# Patient Record
Sex: Female | Born: 1935 | Race: White | Hispanic: No | Smoking: Former smoker
Health system: Southern US, Community
[De-identification: ages and names within clinical notes are randomized; demographics above are authoritative.]

## PROBLEM LIST (undated history)

## (undated) DIAGNOSIS — I714 Abdominal aortic aneurysm, without rupture, unspecified: Secondary | ICD-10-CM

## (undated) DIAGNOSIS — J310 Chronic rhinitis: Secondary | ICD-10-CM

## (undated) DIAGNOSIS — N289 Disorder of kidney and ureter, unspecified: Secondary | ICD-10-CM

## (undated) DIAGNOSIS — I10 Essential (primary) hypertension: Secondary | ICD-10-CM

## (undated) DIAGNOSIS — I701 Atherosclerosis of renal artery: Secondary | ICD-10-CM

## (undated) DIAGNOSIS — I350 Nonrheumatic aortic (valve) stenosis: Secondary | ICD-10-CM

## (undated) DIAGNOSIS — I509 Heart failure, unspecified: Secondary | ICD-10-CM

## (undated) DIAGNOSIS — I2510 Atherosclerotic heart disease of native coronary artery without angina pectoris: Secondary | ICD-10-CM

## (undated) DIAGNOSIS — R0902 Hypoxemia: Secondary | ICD-10-CM

## (undated) HISTORY — PX: OTHER SURGICAL HISTORY: SHX169

## (undated) HISTORY — PX: TUMOR REMOVAL: SHX12

## (undated) HISTORY — DX: Essential (primary) hypertension: I10

## (undated) HISTORY — PX: ANGIOPLASTY: SHX39

## (undated) HISTORY — PX: ABDOMINAL AORTIC ANEURYSM REPAIR: SUR1152

## (undated) HISTORY — DX: Heart failure, unspecified: I50.9

## (undated) HISTORY — DX: Nonrheumatic aortic (valve) stenosis: I35.0

## (undated) HISTORY — DX: Atherosclerotic heart disease of native coronary artery without angina pectoris: I25.10

## (undated) HISTORY — DX: Atherosclerosis of renal artery: I70.1

## (undated) HISTORY — PX: TUBAL LIGATION: SHX77

## (undated) HISTORY — DX: Hypoxemia: R09.02

## (undated) HISTORY — DX: Disorder of kidney and ureter, unspecified: N28.9

## (undated) HISTORY — PX: TOTAL ABDOMINAL HYSTERECTOMY: SHX209

## (undated) HISTORY — PX: HERNIA REPAIR: SHX51

## (undated) HISTORY — PX: APPENDECTOMY: SHX54

## (undated) HISTORY — DX: Chronic rhinitis: J31.0

## (undated) HISTORY — DX: Abdominal aortic aneurysm, without rupture: I71.4

## (undated) HISTORY — DX: Abdominal aortic aneurysm, without rupture, unspecified: I71.40

---

## 1997-07-19 ENCOUNTER — Other Ambulatory Visit: Admission: RE | Admit: 1997-07-19 | Discharge: 1997-07-19 | Payer: Self-pay | Admitting: Family Medicine

## 1998-11-28 ENCOUNTER — Other Ambulatory Visit: Admission: RE | Admit: 1998-11-28 | Discharge: 1998-11-28 | Payer: Self-pay | Admitting: Family Medicine

## 2000-02-12 ENCOUNTER — Other Ambulatory Visit: Admission: RE | Admit: 2000-02-12 | Discharge: 2000-02-12 | Payer: Self-pay | Admitting: Family Medicine

## 2001-03-05 ENCOUNTER — Other Ambulatory Visit: Admission: RE | Admit: 2001-03-05 | Discharge: 2001-03-05 | Payer: Self-pay | Admitting: Family Medicine

## 2003-04-22 ENCOUNTER — Inpatient Hospital Stay (HOSPITAL_COMMUNITY): Admission: AD | Admit: 2003-04-22 | Discharge: 2003-04-29 | Payer: Self-pay | Admitting: General Surgery

## 2003-04-26 ENCOUNTER — Encounter (INDEPENDENT_AMBULATORY_CARE_PROVIDER_SITE_OTHER): Payer: Self-pay | Admitting: Specialist

## 2004-08-06 ENCOUNTER — Inpatient Hospital Stay (HOSPITAL_COMMUNITY): Admission: EM | Admit: 2004-08-06 | Discharge: 2004-08-10 | Payer: Self-pay | Admitting: Emergency Medicine

## 2004-08-07 ENCOUNTER — Ambulatory Visit: Payer: Self-pay | Admitting: Internal Medicine

## 2004-08-07 ENCOUNTER — Encounter (INDEPENDENT_AMBULATORY_CARE_PROVIDER_SITE_OTHER): Payer: Self-pay | Admitting: Cardiology

## 2004-08-08 ENCOUNTER — Ambulatory Visit: Payer: Self-pay | Admitting: Cardiology

## 2004-09-28 ENCOUNTER — Ambulatory Visit (HOSPITAL_COMMUNITY): Admission: RE | Admit: 2004-09-28 | Discharge: 2004-09-28 | Payer: Self-pay | Admitting: Family Medicine

## 2004-10-23 ENCOUNTER — Ambulatory Visit: Payer: Self-pay

## 2004-10-23 ENCOUNTER — Ambulatory Visit: Payer: Self-pay | Admitting: Cardiology

## 2005-01-26 ENCOUNTER — Ambulatory Visit: Payer: Self-pay | Admitting: Cardiology

## 2005-03-09 ENCOUNTER — Ambulatory Visit: Payer: Self-pay | Admitting: Internal Medicine

## 2005-03-09 ENCOUNTER — Ambulatory Visit: Payer: Self-pay | Admitting: Cardiology

## 2005-11-05 ENCOUNTER — Ambulatory Visit: Payer: Self-pay

## 2005-12-20 ENCOUNTER — Encounter: Admission: RE | Admit: 2005-12-20 | Discharge: 2005-12-20 | Payer: Self-pay | Admitting: *Deleted

## 2006-01-08 ENCOUNTER — Ambulatory Visit (HOSPITAL_COMMUNITY): Admission: RE | Admit: 2006-01-08 | Discharge: 2006-01-08 | Payer: Self-pay | Admitting: *Deleted

## 2006-02-26 ENCOUNTER — Ambulatory Visit: Payer: Self-pay | Admitting: Cardiology

## 2006-03-28 ENCOUNTER — Ambulatory Visit: Payer: Self-pay | Admitting: *Deleted

## 2006-04-01 ENCOUNTER — Inpatient Hospital Stay (HOSPITAL_COMMUNITY): Admission: RE | Admit: 2006-04-01 | Discharge: 2006-04-08 | Payer: Self-pay | Admitting: *Deleted

## 2006-04-01 ENCOUNTER — Encounter (INDEPENDENT_AMBULATORY_CARE_PROVIDER_SITE_OTHER): Payer: Self-pay | Admitting: *Deleted

## 2006-04-01 ENCOUNTER — Ambulatory Visit: Payer: Self-pay | Admitting: *Deleted

## 2006-04-16 ENCOUNTER — Ambulatory Visit: Payer: Self-pay | Admitting: *Deleted

## 2006-05-02 ENCOUNTER — Ambulatory Visit: Payer: Self-pay | Admitting: *Deleted

## 2007-03-20 ENCOUNTER — Ambulatory Visit: Payer: Self-pay | Admitting: Cardiology

## 2008-02-02 ENCOUNTER — Ambulatory Visit: Payer: Self-pay | Admitting: Internal Medicine

## 2008-02-02 ENCOUNTER — Inpatient Hospital Stay (HOSPITAL_COMMUNITY): Admission: EM | Admit: 2008-02-02 | Discharge: 2008-02-08 | Payer: Self-pay | Admitting: Emergency Medicine

## 2008-02-02 ENCOUNTER — Ambulatory Visit: Payer: Self-pay | Admitting: Pulmonary Disease

## 2008-02-03 ENCOUNTER — Encounter: Payer: Self-pay | Admitting: Pulmonary Disease

## 2008-02-04 ENCOUNTER — Encounter (INDEPENDENT_AMBULATORY_CARE_PROVIDER_SITE_OTHER): Payer: Self-pay | Admitting: Internal Medicine

## 2008-03-15 ENCOUNTER — Ambulatory Visit: Payer: Self-pay | Admitting: Cardiology

## 2008-03-26 ENCOUNTER — Ambulatory Visit: Payer: Self-pay | Admitting: Cardiology

## 2008-03-26 LAB — CONVERTED CEMR LAB
BUN: 26 mg/dL — ABNORMAL HIGH (ref 6–23)
CO2: 32 meq/L (ref 19–32)
Chloride: 98 meq/L (ref 96–112)
GFR calc non Af Amer: 45 mL/min
Glucose, Bld: 107 mg/dL — ABNORMAL HIGH (ref 70–99)
Potassium: 4.3 meq/L (ref 3.5–5.1)

## 2008-04-01 DIAGNOSIS — I10 Essential (primary) hypertension: Secondary | ICD-10-CM | POA: Insufficient documentation

## 2008-04-02 ENCOUNTER — Ambulatory Visit: Payer: Self-pay | Admitting: Pulmonary Disease

## 2008-04-02 DIAGNOSIS — J309 Allergic rhinitis, unspecified: Secondary | ICD-10-CM | POA: Insufficient documentation

## 2008-04-02 DIAGNOSIS — J9610 Chronic respiratory failure, unspecified whether with hypoxia or hypercapnia: Secondary | ICD-10-CM

## 2008-05-04 ENCOUNTER — Ambulatory Visit: Payer: Self-pay | Admitting: Cardiology

## 2008-05-04 ENCOUNTER — Encounter: Payer: Self-pay | Admitting: Cardiology

## 2008-05-05 ENCOUNTER — Telehealth (INDEPENDENT_AMBULATORY_CARE_PROVIDER_SITE_OTHER): Payer: Self-pay | Admitting: *Deleted

## 2008-05-20 ENCOUNTER — Emergency Department (HOSPITAL_COMMUNITY): Admission: EM | Admit: 2008-05-20 | Discharge: 2008-05-21 | Payer: Self-pay | Admitting: Emergency Medicine

## 2008-06-10 ENCOUNTER — Encounter: Payer: Self-pay | Admitting: Pulmonary Disease

## 2008-09-08 ENCOUNTER — Encounter (INDEPENDENT_AMBULATORY_CARE_PROVIDER_SITE_OTHER): Payer: Self-pay | Admitting: *Deleted

## 2009-02-02 ENCOUNTER — Telehealth: Payer: Self-pay | Admitting: Cardiology

## 2010-02-12 ENCOUNTER — Encounter: Payer: Self-pay | Admitting: Family Medicine

## 2010-02-21 NOTE — Progress Notes (Signed)
Summary: REFILL  Medications Added PLAVIX 75 MG TABS (CLOPIDOGREL BISULFATE) once daily LASIX 40 MG TABS (FUROSEMIDE) 1/2 tab every day LASIX 40 MG TABS (FUROSEMIDE) 1 by mouth daily ABC PLUS  TABS (MULTIPLE VITAMINS-MINERALS) once daily [facility] NIACIN 100 MG TABS (NIACIN) 4 tabs daily [facility] NIACIN 100 MG TABS (NIACIN) 2tabs daily NORVASC 5 MG TABS (AMLODIPINE BESYLATE) once daily NORVASC 5 MG TABS (AMLODIPINE BESYLATE) once daily ADPRIN B 325 MG TABS (ASPIRIN BUF(CACARB-MGCARB-MGO)) once daily ADPRIN B 325 MG TABS (ASPIRIN BUF(CACARB-MGCARB-MGO)) once daily LOVASTATIN 20 MG TABS (LOVASTATIN) once daily * AMLODIPINE BESYLATE 2. 5 MG TABS (AMLODIPINE BESYLATE) 1 1/2 tabs daily * AMLODIPINE BESYLATE 2. 5 MG TABS (AMLODIPINE BESYLATE) 1 tabs daily AMLODIPINE BESYLATE 5 MG TABS (AMLODIPINE BESYLATE) 1 1/2 tabs daily FERRO-BOB 325 (65 FE) MG TABS (FERROUS SULFATE) two times a day FERRO-BOB 325 (65 FE) MG TABS (FERROUS SULFATE) two times a day NITRO-DUR 0.4 MG/HR PT24 (NITROGLYCERIN) as needed NITROGLYCERIN 0.4 MG SUBL (NITROGLYCERIN) as directed VENTOLIN HFA 108 (90 BASE) MCG/ACT AERS (ALBUTEROL SULFATE) take two puffs every 4 hours as needed * ASPRIN 325MG  one by mouth daily       Phone Note Refill Request Message from:  Patient on February 02, 2009 2:04 PM  Refills Requested: Medication #1:  PLAVIX 75 MG TABS once daily SEND TO [place] [phone]  Initial call taken by: Judie Grieve,  February 02, 2009 2:04 PM  Follow-up for Phone Call        Rx sent to pharmacy. Pt notified. Marrion Coy, CNA  February 02, 2009 2:33 PM  Follow-up by: Marrion Coy, CNA,  February 02, 2009 2:33 PM    Prescriptions: PLAVIX 75 MG TABS (CLOPIDOGREL BISULFATE) once daily  #30 x 8   Entered by:   Marrion Coy, CNA   Authorized by:   Rollene Rotunda, MD, [facility]   Signed by:   Marrion Coy, CNA on 02/02/2009   Method used:   Electronically to        [facility]* (retail)  [address] [place], [place]  [address]       Ph: [phone] or [phone]       Fax: [phone]   RxID:   [id number] PLAVIX 75 MG TABS (CLOPIDOGREL BISULFATE) once daily  #30 x 8   Entered by:   Marrion Coy, CNA   Authorized by:   Rollene Rotunda, MD, [facility]   Signed by:   Marrion Coy, CNA on 02/02/2009   Method used:   [facility]ronically to        [id number] ([facility]       [address]       [place], [place]  [address]       Ph: [phone] or [phone]       Fax: [phone]   RxID:   [id number]

## 2010-05-03 LAB — DIFFERENTIAL
Basophils Absolute: 0 10*3/uL (ref 0.0–0.1)
Eosinophils Relative: 2 % (ref 0–5)
Lymphocytes Relative: 23 % (ref 12–46)
Monocytes Absolute: 0.5 10*3/uL (ref 0.1–1.0)

## 2010-05-03 LAB — CBC
HCT: 33.3 % — ABNORMAL LOW (ref 36.0–46.0)
Hemoglobin: 11.1 g/dL — ABNORMAL LOW (ref 12.0–15.0)
RDW: 15.5 % (ref 11.5–15.5)

## 2010-05-03 LAB — BASIC METABOLIC PANEL
CO2: 32 mEq/L (ref 19–32)
GFR calc non Af Amer: 35 mL/min — ABNORMAL LOW (ref >60)
Glucose, Bld: 133 mg/dL — ABNORMAL HIGH (ref 70–99)
Potassium: 4.4 mEq/L (ref 3.5–5.1)
Sodium: 133 mEq/L — ABNORMAL LOW (ref 135–145)

## 2010-05-03 LAB — URINALYSIS, ROUTINE W REFLEX MICROSCOPIC
Bilirubin Urine: NEGATIVE
Glucose, UA: NEGATIVE mg/dL
Hgb urine dipstick: NEGATIVE
Ketones, ur: NEGATIVE mg/dL
Nitrite: NEGATIVE
pH: 6 (ref 5.0–8.0)

## 2010-05-08 LAB — BASIC METABOLIC PANEL
BUN: 29 mg/dL — ABNORMAL HIGH (ref 6–23)
BUN: 38 mg/dL — ABNORMAL HIGH (ref 6–23)
BUN: 38 mg/dL — ABNORMAL HIGH (ref 6–23)
BUN: 41 mg/dL — ABNORMAL HIGH (ref 6–23)
BUN: 44 mg/dL — ABNORMAL HIGH (ref 6–23)
CO2: 29 mEq/L (ref 19–32)
CO2: 35 mEq/L — ABNORMAL HIGH (ref 19–32)
CO2: 36 mEq/L — ABNORMAL HIGH (ref 19–32)
CO2: 40 mEq/L — ABNORMAL HIGH (ref 19–32)
CO2: 38 mEq/L — ABNORMAL HIGH (ref 19–32)
Calcium: 8.5 mg/dL (ref 8.4–10.5)
Calcium: 8.7 mg/dL (ref 8.4–10.5)
Calcium: 9 mg/dL (ref 8.4–10.5)
Calcium: 8.4 mg/dL (ref 8.4–10.5)
Chloride: 98 mEq/L (ref 96–112)
Creatinine, Ser: 1.47 mg/dL — ABNORMAL HIGH (ref 0.4–1.2)
Creatinine, Ser: 1.58 mg/dL — ABNORMAL HIGH (ref 0.4–1.2)
Creatinine, Ser: 1.64 mg/dL — ABNORMAL HIGH (ref 0.4–1.2)
Creatinine, Ser: 1.77 mg/dL — ABNORMAL HIGH (ref 0.4–1.2)
Creatinine, Ser: 1.78 mg/dL — ABNORMAL HIGH (ref 0.4–1.2)
Creatinine, Ser: 1.94 mg/dL — ABNORMAL HIGH (ref 0.4–1.2)
Creatinine, Ser: 1.6 mg/dL — ABNORMAL HIGH (ref 0.4–1.2)
GFR calc Af Amer: 37 mL/min — ABNORMAL LOW (ref >60)
GFR calc non Af Amer: 29 mL/min — ABNORMAL LOW (ref >60)
GFR calc non Af Amer: 31 mL/min — ABNORMAL LOW (ref >60)
GFR calc non Af Amer: 33 mL/min — ABNORMAL LOW (ref >60)
GFR calc non Af Amer: 28 mL/min — ABNORMAL LOW (ref >60)
GFR calc non Af Amer: 30 mL/min — ABNORMAL LOW (ref >60)
Glucose, Bld: 117 mg/dL — ABNORMAL HIGH (ref 70–99)
Glucose, Bld: 127 mg/dL — ABNORMAL HIGH (ref 70–99)
Glucose, Bld: 129 mg/dL — ABNORMAL HIGH (ref 70–99)
Glucose, Bld: 115 mg/dL — ABNORMAL HIGH (ref 70–99)
Potassium: 4.4 mEq/L (ref 3.5–5.1)
Potassium: 4.4 mEq/L (ref 3.5–5.1)
Sodium: 142 mEq/L (ref 135–145)
Sodium: 145 mEq/L (ref 135–145)

## 2010-05-08 LAB — CBC
HCT: 28 % — ABNORMAL LOW (ref 36.0–46.0)
HCT: 28 % — ABNORMAL LOW (ref 36.0–46.0)
Hemoglobin: 9.4 g/dL — ABNORMAL LOW (ref 12.0–15.0)
Hemoglobin: 9.9 g/dL — ABNORMAL LOW (ref 12.0–15.0)
MCHC: 31.7 g/dL (ref 30.0–36.0)
MCHC: 31.7 g/dL (ref 30.0–36.0)
MCHC: 31.9 g/dL (ref 30.0–36.0)
MCHC: 31.9 g/dL (ref 30.0–36.0)
MCV: 85.5 fL (ref 78.0–100.0)
MCV: 86.1 fL (ref 78.0–100.0)
MCV: 86.2 fL (ref 78.0–100.0)
MCV: 85.3 fL (ref 78.0–100.0)
Platelets: 267 10*3/uL (ref 150–400)
Platelets: 292 10*3/uL (ref 150–400)
Platelets: 307 10*3/uL (ref 150–400)
Platelets: 245 10*3/uL (ref 150–400)
RBC: 3.45 MIL/uL — ABNORMAL LOW (ref 3.87–5.11)
RDW: 15.3 % (ref 11.5–15.5)
RDW: 15.4 % (ref 11.5–15.5)
RDW: 15.3 % (ref 11.5–15.5)
WBC: 9.1 10*3/uL (ref 4.0–10.5)

## 2010-05-08 LAB — CARDIAC PANEL(CRET KIN+CKTOT+MB+TROPI)
CK, MB: 2.2 ng/mL (ref 0.3–4.0)
Relative Index: INVALID (ref 0.0–2.5)
Troponin I: 0.01 ng/mL (ref 0.00–0.06)
Troponin I: 0.02 ng/mL (ref 0.00–0.06)

## 2010-05-08 LAB — LEGIONELLA ANTIGEN, URINE: Legionella Antigen, Urine: NEGATIVE

## 2010-05-08 LAB — CULTURE, BLOOD (ROUTINE X 2)

## 2010-05-08 LAB — BODY FLUID CELL COUNT WITH DIFFERENTIAL
Eos, Fluid: 0 %
Total Nucleated Cell Count, Fluid: 945 cu mm (ref 0–1000)

## 2010-05-08 LAB — CK TOTAL AND CKMB (NOT AT ARMC)
CK, MB: 3.4 ng/mL (ref 0.3–4.0)
Relative Index: INVALID (ref 0.0–2.5)
Total CK: 32 U/L (ref 7–177)

## 2010-05-08 LAB — URINALYSIS, ROUTINE W REFLEX MICROSCOPIC
Bilirubin Urine: NEGATIVE
Hgb urine dipstick: NEGATIVE
Ketones, ur: NEGATIVE mg/dL
Nitrite: NEGATIVE
Specific Gravity, Urine: 1.018 (ref 1.005–1.030)
Urobilinogen, UA: 1 mg/dL (ref 0.0–1.0)

## 2010-05-08 LAB — DIFFERENTIAL
Basophils Absolute: 0 10*3/uL (ref 0.0–0.1)
Basophils Relative: 0 % (ref 0–1)
Eosinophils Absolute: 0.1 10*3/uL (ref 0.0–0.7)
Eosinophils Relative: 1 % (ref 0–5)
Lymphocytes Relative: 22 % (ref 12–46)
Monocytes Absolute: 0.6 10*3/uL (ref 0.1–1.0)

## 2010-05-08 LAB — URINE CULTURE

## 2010-05-08 LAB — STREP PNEUMONIAE URINARY ANTIGEN: Strep Pneumo Urinary Antigen: NEGATIVE

## 2010-05-08 LAB — BLOOD GAS, ARTERIAL
Acid-Base Excess: 7 mmol/L — ABNORMAL HIGH (ref 0.0–2.0)
Bicarbonate: 32.5 mEq/L — ABNORMAL HIGH (ref 20.0–24.0)
TCO2: 34.3 mmol/L (ref 0–100)
pCO2 arterial: 60.3 mmHg (ref 35.0–45.0)
pO2, Arterial: 52.6 mmHg — ABNORMAL LOW (ref 80.0–100.0)

## 2010-05-08 LAB — PROTEIN, BODY FLUID

## 2010-05-08 LAB — TYPE AND SCREEN

## 2010-05-08 LAB — D-DIMER, QUANTITATIVE: D-Dimer, Quant: 4.45 ug/mL-FEU — ABNORMAL HIGH (ref 0.00–0.48)

## 2010-05-08 LAB — IRON AND TIBC: Iron: 25 ug/dL — ABNORMAL LOW (ref 42–135)

## 2010-05-08 LAB — BRAIN NATRIURETIC PEPTIDE: Pro B Natriuretic peptide (BNP): 682 pg/mL — ABNORMAL HIGH (ref 0.0–100.0)

## 2010-05-08 LAB — URINE MICROSCOPIC-ADD ON

## 2010-05-08 LAB — BODY FLUID CULTURE: Culture: NO GROWTH

## 2010-05-08 LAB — TROPONIN I: Troponin I: 0.01 ng/mL (ref 0.00–0.06)

## 2010-05-08 LAB — MAGNESIUM: Magnesium: 2 mg/dL (ref 1.5–2.5)

## 2010-06-06 NOTE — Assessment & Plan Note (Signed)
[facility]                            CARDIOLOGY OFFICE NOTE   Eileen Cruz, Eileen Cruz                        MRN:          [id number]  DATE:03/15/2008                            DOB:          1915/06/09    PRIMARY CARE PHYSICIAN:  Windle Guard, MD   REASON FOR PRESENTATION:  Evaluate the patient with heart failure and a  well-preserved ejection fraction.   HISTORY OF PRESENT ILLNESS:  The patient was hospitalized on February 02, 2008, with acute hypoxemia.  She was thought possibly to have a  pneumonia and was treated for such.  She was also felt to have edema  with a well-preserved ejection fraction.  She was managed with  antibiotics.  She did have a right pleural effusion which was tapped and  found to be transudative.  She was treated with IV Lasix as well.  She  was treated with bronchodilators and eventually sent home on oxygen.  She had been on this p.r.n. before, but was sent home on it,  continuously at 4 L.  She has had a home health nurse who has been able  to decrease this to 3 L.  However, walking the patient around the office  today on 3 L, she dropped into the mid 80s saturation.  She says her  breathing feels fine.  She is not having PND or orthopnea.  She is not  having any palpitations, presyncope, or syncope.  She is not having any  chest discomfort, neck or arm discomfort.   PAST MEDICAL HISTORY:  1. Hypertension x20 years.  2. Mild renal insufficiency.  3. Well-preserved ejection fraction (EF 60%).  4. Mild aortic stenosis by echo.  5. Recent pneumonia.  6. Iron deficiency anemia.  7. History of abdominal aortic aneurysm status post repair in March      2008 by Dr. Madilyn Fireman.  8. Left renal artery stenosis.  9. Glaucoma.  10.Bilateral cataracts.  11.Coronary artery disease (totally occluded right coronary artery      with collaterals.  She underwent Taxus stenting on August 09, 2004).  12.Benign tumor resected from the right  shoulder in 1970.  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  14.Inguinal hernia repair.   ALLERGIES AND INTOLERANCES:  She has been intolerant to beta-blocker.   MEDICATIONS:  1. Plavix 75 mg daily.  2. Aspirin 325 mg daily.  3. Lovastatin 20 mg daily.  4. Niacin 2000 mg b.i.d.  5. Amlodipine 2.5 mg daily.  6. Furosemide 40 mg daily.  7. Iron.   REVIEW OF SYSTEMS:  As stated in the HPI and negative for other systems.   PHYSICAL EXAMINATION:  GENERAL:  The patient is pleasant and in no  distress.  VITAL SIGNS:  Blood pressure 140/70, heart rate 76 and regular, and  weight 174 pounds.  HEENT:  Eyelids unremarkable.  Pupils equal, round, and react to light.  Fundi not visualized.  Oral mucosa unremarkable.  NECK:  No jugular venous distention at 45 degrees.  Carotid upstroke  brisk and symmetric.  No bruits.  No thyromegaly.  LYMPHATICS:  No  cervical, axillary, or inguinal adenopathy.  LUNGS:  Decreased breath sounds, but no wheezing or crackles  bilaterally.  BACK:  No costovertebral angle tenderness.  CHEST:  Unremarkable.  HEART:  PMI not displaced or sustained.  S1 and S2 within normal limits.  No S3, no S4.  No clicks, no rubs, no murmurs.  ABDOMEN:  Obese, positive bowel sounds, normal in frequency and pitch.  No bruits, no rebound, no guarding.  No midline pulsatile mass.  No  organomegaly.  SKIN:  No rashes, no nodules.  EXTREMITIES:  Pulses 2+.  No edema, no cyanosis, no clubbing.  NEUROLOGIC:  Oriented to person, place, and time.  Cranial nerves II-XII  grossly intact.  Motor grossly intact.   EKG, sinus rhythm, rate 76, left axis deviation, no acute ST-T wave  changes.   ASSESSMENT AND PLAN:  1. Dyspnea.  The patient still has profound drops in her saturation      even on 3 L of oxygen.  I think this is very out of portion to any      cardiac disease.  She was not sent home on any bronchodilators or      followup with Pulmonary.   Therefore, I will request a Pulmonary      office visit.  For now, she will continue on the meds as listed.  I      would like for her to get BNP and basic metabolic profile, and we      will arrange to have these drawn.  2. Coronary artery disease.  I do not think she is having any active      ischemia.  At this point, we would manage this conservatively.  She      will continue with risk reduction.  3. Hypertension.  Blood pressure is controlled on the meds listed.      She will continue with these.  4. Renal insufficiency.  I will check this with the BMET ordered.  5. Anemia per Dr. Jeannetta Nap.  6. Abdominal aortic aneurysm repair.  This is repaired in 2008.  I      will follow up to see if the      patient continues to see Dr. Madilyn Fireman.  7. Followup.  I would like to see her back in a couple of months to      reassess.     Rollene Rotunda, MD, [facility]  Electronically Signed    JH/MedQ  DD: 03/15/2008  DT: 03/16/2008  Job #: [id number]   cc:   Windle Guard, M.D.

## 2010-06-06 NOTE — Consult Note (Signed)
NAMEHODAYA, Eileen Cruz NO.:  [id number]   MEDICAL RECORD NO.:  [id number]          PATIENT TYPE:  INP   LOCATION:  3304                         FACILITY:  [facility]   PHYSICIAN:  Duke Salvia, MD, FACCDATE OF BIRTH:  17-Aug-1915   DATE OF CONSULTATION:  02/03/2008  DATE OF DISCHARGE:                                 CONSULTATION   PRIMARY CARDIOLOGIST:  Rollene Rotunda, MD, [facility]   PRIMARY CARE PHYSICIAN:  Windle Guard, MD   REASON FOR CONSULTATION:  CHF.   HISTORY OF PRESENT ILLNESS:  This is a 77 year old Caucasian female with  known history of CAD, status post drug-eluting stent to the right  coronary artery in July 2006, also history of AAA repair in March 2008,  with a history of hypertension and anemia who was being treated as an  outpatient for pneumonia by primary care physician, secondary to right-  sided chest discomfort and then shortness of breath along with cough and  congestion.  The patient also was complaining of some pain with  inspiration.  Over the last 2 weeks, the patient began to feel weaker  despite medications had a increased dyspnea on exertion, shortness of  breath, coughing, and congestion.  She called her son who notified Dr.  Jeannetta Nap, the patient's status and was brought to the emergency room.  Originally, the symptoms started approximately 3 weeks ago, but these  symptoms had been increasing over the last 2 days with more weakness,  shortness of breath, and dyspnea on exertion.  Chest x-ray revealed  pulmonary edema with moderate right pleural effusion.  She was placed on  IV Lasix by admitting MD of 40 mg b.i.d. and given breathing treatments.  Unfortunately, the patient de sats very easily.  She is on a 100% non-  rebreather and when she takes the mask off to take a bite of food, she  drops into the 70s.  The patient has a brother who recently died from a  CVA on Christmas Eve and she has been traveling a lot to [place] during  that  time and had been neglecting being seen by her primary care  physician thinking, it was just a cold, but as symptoms worsened, she  did see Dr. Jeannetta Nap approximately 1 week ago.   REVIEW OF SYSTEMS:  Positive for chest soreness with inspiration  approximately 1 week ago with pain on the right side, shortness of  breath, increasing dyspnea on exertion, cough and congestion, positive  for wheezing.  She denied any frank edema.  She states that she has some  lower extremity edema chronically.  She denies any nausea, vomiting,  diarrhea or fevers, or chills.   PAST MEDICAL HISTORY:  1. Hypertension x20 years.  2. Anemia chronically.  3. History of AAA diagnosed in July 2006, with a AAA repair in March      2008 by Dr. Madilyn Fireman.  4. She has high-grade left renal artery stenosis.  5. She has CAD, status post stent to the total right coronary artery      in July 2006, using tandem overlying Taxus  drug-eluting stents.  6. She had a cardiac catheterization in July 2006, revealing      nonobstructive CAD and the LAD, anomalous left circ, totally-      occluded proximal right coronary artery with clot with left-to-      right collaterals.   PAST SURGICAL HISTORY:  AAA repair in March 2008.   SOCIAL HISTORY:  She lives in [place] alone.  She is retired.  She is  a widow.  She has 2 sons, one in [place] and one here in [place].  She stopped smoking years ago.  Negative for EtOH and negative for  drug use.   FAMILY HISTORY:  Mother deceased in childbirth.  Father deceased with  lung cancer, was a heavy smoker.  She had two brothers who are deceased  from MI and one brother who is recently deceased from a CVA.   CURRENT MEDICATIONS:  1. Albuterol inhaler q.4 h.  2. Amlodipine 7.5 mg daily.  3. Aspirin 325 daily.  4. Plavix 75 mg daily.  5. Lasix 40 mg IV b.i.d.  6. Atrovent 0.5 mg q.4 h.  7. Imdur 30 mg daily.  8. Avelox 400 mg IV q.24 h.  9. Nystatin.  10.Protonix 40 mg daily.   11.Zocor 10 mg nightly.   ALLERGIES:  The patient has no known drug allergies, but she is  intolerant to beta-blockers causing bradycardia.   CURRENT LABORATORIES:  Sodium 142, potassium 3.9, chloride 94, CO2 of  32, BUN 29, creatinine 1.5, GFR 37, and D-dimer is pending, and BNP 906.  Hemoglobin 9.0 (decreased from 9.9 on admission), hematocrit 28, white  blood cells 8.9, and platelets 267.  Troponin 0.01 and 0.02  respectively.  EKG is pending.  Telemetry reveals irregular rhythm with  bigeminy.  Chest x-ray reveals possible pulmonary edema, moderate right  pleural effusion, and mild vascular congestions with cardiomegaly.   PHYSICAL EXAMINATION:  VITAL SIGNS:  Blood pressure 139/35, pulse 75,  respirations 30, temperature 99, O2 sat 91% and 50% Venti mask.  The  patient is currently on 100% nonrebreather.  The patient's weight is  79.6 kg.  GENERAL:  She is awake, alert, and oriented; easily dyspneic.  HEENT:  Head is normocephalic and atraumatic.  Eyes:  PERRLA.  Mucous  membranes:  Mouth pink and moist.  Tongue is midline.  NECK:  Supple.  There is mild JVD without carotid bruits.  CARDIOVASCULAR:  Irregular rate and rhythm with multiple extrasystole.  Pulses are 2+ and equal without bruits.  LUNGS:  Expiratory wheezes and diminished breath sounds on the right.  ABDOMEN:  Soft and nontender with 2+ bowel sounds.  EXTREMITIES:  No clubbing, cyanosis, or edema at present.  NEUROLOGIC:  Cranial nerves II-XII are grossly intact.   IMPRESSIONS:  1. Respiratory insufficiency with abnormal chest x-ray and arterial      blood gas.  2. Known coronary artery disease, status post drug-eluting stent x2 to      the right coronary artery in July 2006.  3. Anemia.  4. Hypertension, well controlled.   PLAN:  This is a 77 year old Caucasian female with known history of CAD,  AAA repair, hypertension, and anemia who presented to [facility]  Emergency Room with increasing shortness of breath  and dyspnea on  exertion with increasing symptoms occurring over the last 3 weeks most  prominent over the last day prior to admission.   The patient has marked right-sided infiltrates with questionable process  etiology, infectious versus fluid with an elevated  BNP.   RECOMMENDATIONS:  Gentle diuresis, to increase Lasix to 80 mg IV b.i.d.  We have spoken with pulmonary for consultation.  We have discussed with  the patient that it may be necessary to intubate and she is agreeable to  this.  We will await echocardiogram to rule out effusion and changes in  LV function.  The patient with anemia, we will guaiac stools.  We will  check magnesium and check her 12-lead and make further recommendations  based upon hospital course and patient's response to treatment.      Bettey Mare. Lyman Bishop, NP      Duke Salvia, MD, [facility]  Electronically Signed    KML/MEDQ  D:  02/03/2008  T:  02/04/2008  Job:  [id number]   cc:   Windle Guard, M.D.

## 2010-06-06 NOTE — Discharge Summary (Signed)
Eileen Cruz, Eileen Cruz                 ACCOUNT NO.:  [id number]   MEDICAL RECORD NO.:  [id number]          PATIENT TYPE:  INP   LOCATION:  5528                         FACILITY:  [facility]   PHYSICIAN:  Elliot Cousin, M.D.    DATE OF BIRTH:  Apr 30, 1915   DATE OF ADMISSION:  02/02/2008  DATE OF DISCHARGE:  02/08/2008                               DISCHARGE SUMMARY   DISCHARGE DIAGNOSES:  1. Acute hypoxic and hypercarbic respiratory failure thought to be      secondary to a combination of acute congestive heart failure and      pneumonia.  2. Acute congestive heart failure, possibly secondary to diastolic      dysfunction.  Per the 2-D echocardiogram performed on February 04, 2008, the patient's ejection fraction was noted to be 55-60%.  3. Mild aortic stenosis per 2-D echocardiogram on February 04, 2008.  4. Pneumonia with small right-sided pleural effusion.  Status post      thoracentesis by Dr. Sung Amabile on February 03, 2008.  5. Acute-on-chronic renal insufficiency.  6. Iron deficiency anemia.   SECONDARY DISCHARGE DIAGNOSES:  1. Hypertension.  2. Chronic anemia.  3. History of abdominal aortic aneurysm, diagnosed in July 2006 with      an abdominal aortic aneurysm repair in March 2008 by Dr. Madilyn Fireman.  4. High-grade left renal artery stenosis.  5. Coronary artery disease status post stent to the total right      coronary artery in July 2006 using tandem overlying Taxus drug-      eluting stents.  6. Status post cardiac catheterization in July 2006 revealing      nonobstructive coronary artery disease in the left anterior      descending, anomalous left circumflex, totally occluded proximal      right coronary artery with clot with left-to-right collaterals.   DISCHARGE MEDICATIONS:  1. Oxygen 4 liters per minute.  2. Avelox 400 mg daily for 5 more days.  3. Ferrous sulfate 325 mg once daily for 1 week and then increase to      b.i.d. thereafter.  4. Albuterol inhaler 2 puffs every  4 hours as needed for wheezing and      shortness of breath.  5. Furosemide 40 mg one whole pill daily (dose increased from half a      pill daily).  6. Lovastatin 20 mg daily.  7. Plavix 75 mg daily.  8. Amlodipine 2.5 mg daily.  9. Aspirin 325 mg daily.   DISCHARGE DISPOSITION:  The patient is being discharged to home in  improved and stable condition.  She was advised to follow up with her  primary cardiologist, Dr. Antoine Poche in 5-7 days and with her primary care  physician, Dr. Jeannetta Nap in 4-5 days.   CONSULTATIONS:  1. Duke Salvia, MD, [facility], and Rollene Rotunda, MD, [facility],      cardiologists.  2. Oley Balm. Sung Amabile, MD   PROCEDURES PERFORMED:  1. Chest x-ray on February 07, 2008.  The results revealed no change in      the right worse than  left airspace disease and effusions.  No      pulmonary edema.  2. Chest x-ray on February 05, 2008.  No change in congestive heart      failure.  Persistent right lower lobe pneumonia.  3. Thoracentesis performed by Dr. Billy Fischer on February 03, 2008.      The results revealed 15 mL of cloudy fluid.  Results of studies      revealed a total protein of less than 3, LDH of 112, cell count of      945 (4% segmented neutrophils and 91% lymphocytes).  4. A 2-D echocardiogram performed on February 04, 2008.  The results      revealed left ventricular ejection fraction estimated to be 55-60%.      No diagnostic left ventricular regional wall motion abnormality.      Left atrium was moderately dilated.  Mild aortic stenosis.  5. Right upper extremity peripherally inserted central catheter      inserted on February 02, 2008, and discontinued on February 08, 2008.   HISTORY OF PRESENT ILLNESS:  The patient is a 77 year old woman with a  past medical history significant for coronary artery disease, status  post abdominal aortic aneurysm repair, hypertension, and chronic anemia.  She presented to the emergency department on February 02, 2008, with a   chief complaint of shortness of breath.  Apparently, the patient  developed shortness of breath and a cough 1 week prior.  She was treated  with doxycycline for pneumonia by her primary care physician.  However,  her symptoms did not improve.  When she was evaluated in the emergency  department, she was noted to be hypoxic with an oxygen saturation of 77%  on room air.  Her chest x-ray revealed a right lower lobe pneumonia and  possible right pleural effusion.  A PICC line was placed in the  emergency department.  She was placed on 100% oxygen supplementation and  subsequently admitted to the ICU.   For additional details, please see the dictated history and physical.   HOSPITAL COURSE:  1. ACUTE HYPOXIC AND HYPERCARBIC RESPIRATORY FAILURE ASSOCIATED WITH      CONGESTIVE HEART FAILURE AND PNEUMONIA.  The patient was admitted      to the ICU for closer monitoring.  She was started empirically on      antibiotic treatment with Zosyn.  Oxygen therapy was administered      to keep her oxygen saturations greater than 90%.  Because of her      cardiac history, cardiac enzymes were ordered as well as a 2-D      echocardiogram.  Her cardiac enzymes were negative and her ejection      fraction was within normal limits, although the echo did reveal      mild aortic stenosis.  A number of other laboratory studies were      ordered including a lactic acid level, which was normal at 0.9; a      urinary strep pneumo antigen, which was negative; and a D-dimer      that was elevated at 4.45.  An ABG on 50% oxygen was ordered and it      revealed a pH of 7.3, pCO2 of 60.3, and a pO2 of 52.6.  Her BNP was      elevated at 906.  Because of the elevated D-dimer, a V/Q scan was      ordered.  Subsequently, [facility]ology were  consulted.  Dr. Graciela Husbands evaluated the patient from a cardiology      standpoint.  Given the patient's overall clinical scenario, he felt      that the patient  had concomitant congestive heart failure.      Therefore, the patient was started on intravenous Lasix b.i.d.  Dr.      Graciela Husbands recommended further evaluation by Pulmonary.  Dr. Sung Amabile      provided the pulmonary consultation.  Given the evidence of a      pleural effusion on chest x-ray, he performed a thoracentesis.  It      yielded 15 mL of a cloudy fluid.  The results of the studies are      above; however in essence, it appeared that the studies indicated      that the pleural fluid was more transudative rather than exudative.      At the time of the initial hospital assessment, the patient was      afebrile and her white blood cell count was marginally elevated.      She was maintained on Zosyn for 24 hours before it was changed to      Avelox.  Dr. Sung Amabile cancelled the V/Q scan in the setting of an      acute pneumonia.  He added albuterol and Atrovent nebulizers to be      scheduled every 6 hours.  The dosing of the Lasix was titrated      downward to 40 mg p.o. daily after she had an effective diuresis.      A followup chest x-ray revealed no significant improvement,      although the patient was significantly symptomatically improved.      She completed a total of 7 days of antibiotic therapy.  She was      discharged home on 5 more days of treatment.  Because of her      ongoing hypoxia, the patient required home oxygen.  On February 07, 2008, her oxygen saturations fell to 77% on 3 L of nasal cannula      oxygen with ambulation.  At rest, her oxygen saturations ranged      from 80-94% on 3-4 L of oxygen supplementation.  Arrangements have      been made for the patient to be discharged home on oxygen therapy.      Prior to hospital discharge, she had no complaints of shortness of      breath or chest pain.  2. ACUTE-ON-CHRONIC RENAL INSUFFICIENCY.  The patient was initially      started on gentle IV fluids.  Her BUN was 36 and her creatinine was      1.47 initially.  With  the initiation of diuretic therapy, her      creatinine increased to 1.94.  The dosing of the Lasix was      decreased.  Prior to hospital discharge, her creatinine improved      slightly to 1.6.  The patient appears to have chronic renal      insufficiency with a creatinine ranging from 1.4-1.6.  3. IRON DEFICIENCY ANEMIA.  The patient's hemoglobin ranged from 8.9      to 9.9 during the hospitalization.  Iron studies were ordered and      revealed a ferritin of 62, total iron of 25, TIBC of 294, and a      percent saturation of 9.  She was started on iron supplementation.  She was advised to continue ferrous sulfate as prescribed upon      discharge.      Elliot Cousin, M.D.  Electronically Signed     DF/MEDQ  D:  02/08/2008  T:  02/09/2008  Job:  [id number]   cc:   Rollene Rotunda, MD, [facility]  Windle Guard, M.D.

## 2010-06-06 NOTE — H&P (Signed)
NAMEJIMMI, SIDENER                 ACCOUNT NO.:  [id number]   MEDICAL RECORD NO.:  [id number]          PATIENT TYPE:  INP   LOCATION:  3304                         FACILITY:  [facility]   PHYSICIAN:  Michelle Nasuti, MD        DATE OF BIRTH:  July 01, 1915   DATE OF ADMISSION:  02/02/2008  DATE OF DISCHARGE:                              HISTORY & PHYSICAL   CHIEF COMPLAINT:  Shortness of breath.   HISTORY OF PRESENT ILLNESS:  This is a very pleasant 77 year old female  with a history of coronary artery disease status post PCI, abdominal  aortic aneurysm status post repair 1 year ago who presents with  shortness of breath for 1 week.  The patient began feeling unwell  approximately 1 week ago when she developed cough and shaking chills.  She states that she has only had low-grade fevers at home.  Her cough  and shortness of breath progressed, and she saw her family physician  this past Thursday.  He performed a chest x-ray which showed right lower  lobe pneumonia, and he prescribed doxycycline for her as an outpatient.  Despite of doxycycline, her shortness of breath continued to progress  until this morning, the day of admission when she noticed not only  shortness of breath but pleuritic chest pain.  She thus presented to the  [facility] Emergency Department for further evaluation.   In the emergency department, she was found to be sating 77% on room air  and in moderate distress.  She was initially placed on a 100% face mask  with improvement in her oxygen saturation.  She received nebulizer  treatment.  She also received IV vancomycin and Zosyn.  A chest x-ray  was repeated, which showed a right lower lobe pneumonia, possible right  pleural effusion.  The patient was treated with intravenous Zosyn and  vancomycin.  In the emergency department, she also received a PICC line  because of difficult intravenous access.   PAST MEDICAL HISTORY:  The patient has a history of coronary artery  disease status post percutaneous coronary intervention.  She has a  history of AAA status post open repair 1 year ago.  She has a history of  hyperlipidemia, history of hypertension.   CURRENT MEDICATIONS AT HOME:  1. Lovastatin 20 mg daily.  2. Plavix 75 mg daily.  3. Amlodipine 7.5 mg daily.  4. Furosemide 40 mg daily.  5. Aspirin 325 mg daily.  6. Doxycycline 100 mg b.i.d.   SOCIAL HISTORY:  The patient lives alone.  She is able to accomplish her  activities of daily living.  She quit smoking 30 years ago.  She does  not drink alcohol or use illicit drugs.   FAMILY HISTORY:  Noncontributory.   REVIEW OF SYSTEMS:  The patient denies nausea, vomiting, or diarrhea.  She has had anorexia with this current illness.  She does admit to some  chest pain today; however, the pain is unlike her prior myocardial  infarction pain.  It is pleuritic in nature.  Review of systems is  otherwise negative, except as  documented in the history of present  illness.   PHYSICAL EXAMINATION:  VITAL SIGNS:  Temperature is 97.3, the heart rate  is 86, and the respiratory rate is 24, the blood pressure is 134/62.  GENERAL:  She is in no acute distress.  HEENT:  She is anicteric.  There is no conjunctival pallor.  The pupils  are equal, round, and reactive to light and accommodation.  There  appears to be a white covering to the tongue.  There is no jugular  venous distention.  CARDIOVASCULAR:  An irregular rhythm.  There are no rubs, murmurs, or  gallops.  PULMONARY:  Distant breath sounds bilaterally.  There are crackles at  the right base.  ABDOMEN:  Soft, nontender, and nondistended.  There is normoactive bowel  sounds.  EXTREMITIES:  Warm and well perfused without edema.   ANCILLARY CLINICAL DATA:  Chest x-ray reveals a right lower lobe  pneumonia and likely right pleural effusion.  The EKG shows no acute  ischemic changes.  There is nonspecific intraventricular conduction  delay and there is  ventricular trigeminy.   Labs are currently pending.   ASSESSMENT AND PLAN:  This is a pleasant 77 year old female being  admitted with right lower lobe pneumonia and hypoxemia.  Plan by  problem,  1. Pneumonia.  We will treat Ms. Seals with intravenous Zosyn.  Since      she has not been hospitalized or lived in a nursing home for      greater than a year, at this point we will not treat her for      hospital-acquired pneumonia.  She will receive Zosyn as      monotherapy.  We will also treat her with DuoNebs and albuterol      p.r.n. for improved aeration.  She will receive supplemental oxygen      to keep her oxygen saturation above 92%.  We will check urine      pneumococcus and urine Legionella.  We will also check blood      cultures and sputum cultures.  2. Chest pain.  The chest pain is likely secondary to her pneumonia.      Given her cardiac history, we will continue on aspirin and Plavix      and we will rule her out by serial enzymes for myocardial      infarction.  3. Hypertension.  We will continue amlodipine at the present time.  We      will hold her furosemide given the fact that she is infected and      may be hypovolemic.  This may be restarted if she shows signs of      volume overload.  4. Coronary artery disease.  We will continue her aspirin and Plavix      at the present time.  We will rule out for myocardial infarction as      above.  5. Apparent thrush.  We will prescribe nystatin swish and spit      therapy.  6. Deep vein thrombosis prophylaxis.  She will be on Lovenox.  7. Gastrointestinal prophylaxis.  She will be on Protonix.  8. Fluids, electrolytes, nutrition.  In the unlikely event that this      pneumonia is caused by aspiration, we will ask speech therapy to      consult and do a speech and swallow study to rule out aspiration      given her age and the fact that this is a right  lower lobe      pneumonia. Her current antibiotic therapy should cover  this.      Michelle Nasuti, MD  Electronically Signed     RK/MEDQ  D:  02/02/2008  T:  02/03/2008  Job:  [id number]   cc:   Windle Guard, M.D.

## 2010-06-06 NOTE — Assessment & Plan Note (Signed)
[facility]                            CARDIOLOGY OFFICE NOTE   Eileen Cruz, Eileen Cruz                        MRN:          [id number]  DATE:03/20/2007                            DOB:          Aug 11, 1915    PRIMARY:  Dr. Windle Guard.   REASON FOR VISIT:  Evaluate patient coronary disease.   HISTORY OF PRESENT ILLNESS:  The patient is a pleasant 77 year old who  presents for follow-up of her coronary disease.  Since I last saw her  she has had repair of her abdominal aortic aneurysm by Dr. Madilyn Fireman.  This  was an open repair.  She had did relatively well with this.  Since I saw  her she has had no chest pressure, neck or arm discomfort.  She had no  palpitation, presyncope or syncope.  She remains relatively active going  to church and grocery store.   PAST MEDICAL HISTORY:  Coronary artery disease (no major obstruction.  The LAD system.  She had a totally occluded right coronary artery with  collaterals.  She underwent TAXUS stenting August 09, 2004.  EF 55%),  renal insufficiency, nonsustained ventricle tachycardia abdominal aortic  aneurysm, renal artery stenosis left kidney, hypertension, anemia,  active right great toe secondary to cellulitis, total abdominal  hysterectomy suspected endometrial carcinoma, benign tumor resected  right shoulder 1970, glaucoma, bilateral cataract surgery, inguinal,  femoral hernia repair.   ALLERGIES:  Intolerances the patient has been intolerant to BETA  BLOCKER.   MEDICATIONS:  Plavix 75 mg daily, Lasix 20 mg daily, niacin, amlodipine,  lovastatin 20 mg daily, aspirin 325 mg daily.   REVIEW OF SYSTEMS:  As stated in HPI, otherwise have other systems.   PHYSICAL EXAMINATION:  The patient is in no distress.  Blood pressure 157/72, heart rate 81 and regular, weight 172 pounds,  body mass index 34.  NECK:  No jugular distention 45 degrees carotid upstroke brisk and  symmetrical.  No bruits, thyromegaly.  Lymphatics  no adenopathy.  LUNGS:  Clear to auscultation bilaterally.  BACK:  No costovertebral.  CHEST:  Unremarkable.  HEART:  PMI not displaced or sustained, S1-S2 within normal inch no S3,  no murmurs.  ABDOMEN:  Obese, positive bowel sounds normal frequency and pitch, no  bruits, rebound or guarding or midline pulsatile mass, organomegaly.  SKIN:  No rash.  EXTREMITIES:  2+ pulses.   ASSESSMENT/PLAN:  1. Coronary disease.  Patient is having no further cardiovascular      symptoms.  No further cardiovascular testing is suggested.  The      patient will continue with risk reduction.  2. Hypertension.  She does have her blood pressure is normal at home.      She has reports his in 120s over 50s.  Therefore, will not make any      change to regimen.  3. Follow-up she come back this clinic as needed.  She will have her      lipids and blood pressure followed by Dr. Jeannetta Nap.     Rollene Rotunda, MD, [facility]  Electronically Signed    JH/MedQ  DD: 03/20/2007  DT: 03/21/2007  Job #: [id number]   cc:   Windle Guard, M.D.

## 2010-06-09 NOTE — Consult Note (Signed)
NAMEHANSINI, CLODFELTER NO.:  [id number]   MEDICAL RECORD NO.:  [id number]          PATIENT TYPE:  INP   LOCATION:  5506                         FACILITY:  [facility]   PHYSICIAN:  Rollene Rotunda, M.D.   DATE OF BIRTH:  1915-08-01   DATE OF CONSULTATION:  08/08/2004  DATE OF DISCHARGE:                                   CONSULTATION   PHYSICIANS:  Primary care physician: Windle Guard, M.D.  Referring physician: Alvester Morin, M.D.   REASON FOR CONSULTATION:  Evaluate patient with back, jaw, and arm  discomfort.   HISTORY OF PRESENT ILLNESS:  The patient is a lovely 77 year old white  female with no prior cardiac history.  She has had a history of hypertension  for years.  She lives by herself and does her household chores.  She said  two days prior to this consultation she was preparing some coffee in her  kitchen.  She developed back discomfort.  It was sudden on onset and  moderate in intensity.  (She could not quantify further.)  She had radiation  of this discomfort into her jaw and into the left arm. She described it as  an aching.  It lasted 15 minutes. She rubbed her back against something  hard, and the discomfort did go away.  Because of this, she presented to the  emergency room where she was not noted to have acute EKG changes, though she  had some subtle T wave inversions in II, III, aVF, V5 and V6 without old  EKGs for comparison.  Her point-of-care markers included a mildly elevated  myoglobin.  Since admission, she has had no further discomfort.  She did  have slightly elevated troponin's with normal CK, slightly elevated MB.  There have been no dynamic EKG changes.   The patient had not had this kind of discomfort in the past.  She was  slightly nauseated with this.  There was no diaphoresis.  She said that she  walks with a walker for stability.  She can do chores but gets a little bit  fatigued or winded doing something like vacuuming.  She has no  resting  shortness of breath and denies any PND or orthopnea.  She has had no  palpitations, presyncope, or syncope.   She was noted on telemetry to have a 7-beat run of tachycardia.  The origin  is not clear.   PAST MEDICAL HISTORY:  1.  Hypertension x 16 years.  2.  Anemia.  3.  Cataracts.   PAST SURGICAL HISTORY:  1.  Hysterectomy.  2.  C section.  3.  Right shoulder surgery with excision of a benign tumor.  4.  Cataract surgery.  5.  Inguinal hernia repair.  6.  Amputation of the left toe secondary to osteomyelitis.   ALLERGIES:  The patient did not tolerate BETA BLOCKERS in the past.   MEDICATIONS:  1.  Doxazosin 8 mg daily.  2.  Aspirin 325 mg daily.  3.  Vitamin D.  4.  Multivitamins.   SOCIAL HISTORY:  The patient lives alone.  She  has three children with two  living.  She has 8 grandchildren, 11 great grandchildren, and 2 great great  grandchildren.  She is retired and widowed since 1989.  She quit smoking  several years ago after smoking most of her adult life.   FAMILY HISTORY:  Contributory for one child with heart attack.   REVIEW OF SYSTEMS:  Positive for intermittent diarrhea, constipation, joint  pains, mild lower extremity swelling.  Negative for all other systems.   PHYSICAL EXAMINATION:  GENERAL:  The patient is in no distress.  Her affect  is appropriate and pleasant.  VITAL SIGNS: Blood pressure 90/67, heart rate 57 and regular, afebrile.  HEENT:  Eyelids unremarkable.  Pupils equal, round, and reactive to light.  Fundi not visualized.  Oral mucosa unremarkable.  NECK:  No jugular venous distention. Waveform within normal limits.  Carotid  upstrokes brisk and symmetric.  Bilateral carotid bruits.  No thyromegaly.  LYMPHATICS:  No cervical, axillary, or inguinal adenopathy.  LUNGS:  Clear to auscultation without wheezing or crackles.  BACK:  No costovertebral angle tenderness.  CHEST:  Unremarkable.  HEART:  PMI not displaced or sustained.  S1 and  S2 within normal limits.  No  S3, no S4, no murmurs.  ABDOMEN: Obese, positive bowel sounds, normal pitch.  No bruits, rebound,  guarding, midline pulsatile mass, or tenderness. No hepatomegaly or  splenomegaly.  SKIN:  No rashes or nodules.  EXTREMITIES:  2+ pulses throughout.  No cyanosis, clubbing, or edema.  NEUROLOGIC:  Oriented to person,  place, and time.  Cranial nerves II-XII  grossly intact.  Motor grossly intact.   LABORATORY DATA AND OTHER STUDIES:  EKG: Sinus bradycardia, rate 57, axis  within normal limits, intervals within normal limits.  Poor entry R wave  progression, nonspecific inferior T wave flattening, T wave inversions V4  through V6.   Chest x-ray: Cardiomegaly.   CT negative for PE, negative for dissection, 5.2 cm abdominal aortic  aneurysm (see the final report).   Labs: WBC 7.7, hemoglobin 11.9, platelets 151.  Sodium 141, potassium 4.4,  chloride 103, BUN 21, creatinine 1.2, magnesium 1.9. CK peak 172, MB 7.2,  index 4.1, troponin peak 0.06.  BNP 72.6.  TSH 4.4.  Creatinine clearance  37.6.   ASSESSMENT AND PLAN:  1.  Back/neck discomfort.  The patient's pretest probability of obstructive      coronary disease leading to these symptoms I think is relatively high      given her risk factors of hypertension, age, evidence of carotid bruits,      and the description of the discomfort.  Therefore, I do not think a      Cardiolite would be the [place] test in this situation.  However, cardiac      catheterization carries some higher risk because of her age, creatinine      clearance, and abdominal aortic aneurysm.  I carefully discussed this      with the patient and the son, outlining all of the possibilities.  I do      suggest cardiac catheterization as the safest route to proceed. They      accept the risks, understanding them, and agree to proceed with this.     Further evaluation will be based on these results. I would like to get      an echocardiogram  given her cardiomegaly.  2.  Abdominal aortic aneurysm.  The patient is not interested in having this      fixed,  though she would entertain talking with the surgeon to weigh all      possibilities including endovascular repair.  We will consider this      going forward.  3.  Mild renal insufficiency.  The patient will be hydrated prior to the      procedure gently.  4.  Ectopy.  The patient has had no further ectopy, nonsustained or      otherwise.  Will follow this on telemetry.       JH/MEDQ  D:  08/08/2004  T:  08/08/2004  Job:  [id number]   cc:   Windle Guard, M.D.  [address]  [place], [place] [address]  Fax: [phone]   Alvester Morin, M.D.  [address]  [place]  [place] [address]  Fax: [phone]

## 2010-06-09 NOTE — Discharge Summary (Signed)
NAMETIERNAN, MILLIKIN                 ACCOUNT NO.:  [id number]   MEDICAL RECORD NO.:  [id number]          PATIENT TYPE:  INP   LOCATION:  2024                         FACILITY:  [facility]   PHYSICIAN:  Balinda Quails, M.D.    DATE OF BIRTH:  1915/04/01   DATE OF ADMISSION:  04/01/2006  DATE OF DISCHARGE:                               DISCHARGE SUMMARY   ADDENDUM:  This is an addendum to patient's discharge summary dictated April 05, 2006.   Patient was tentatively ready for discharge April 07, 2006 postop day 6.  Patient's discharge was held secondary to unable to wean O2 off.  Patient was encouraged to use her incentive spirometer and continue  ambulation 3 to 4 times per day.  She continues to be weaned off oxygen.  By postop day 7, April 08, 2006, patient was able to be sating greater  than 90% on room air.  Patient was also tolerating a regular diet. There  were no nausea or vomiting noted.  She was out of bed ambulating well.   Patient tentatively ready for discharge home postop day 7, April 08, 2006.  Labs postop day 6 showed a white count of 5.8, hemoglobin 9.7,  hematocrit 20.8, platelet count 154. Sodium of 138, potassium 3.3,  chloride of 100, bicarb of 38.1, BUN of 14, creatinine 0.95, glucose of  94.   Please see her already dictated discharge summary for followup  appointments and discharge instructions.   DISCHARGE MEDICATIONS:  1. Tylox 150 tabs q.4-6 h. p.r.n. pain.  2. Niacin 200 mg b.i.d.  3. Lovastatin 20 mg daily.  4. Lasix 20 mg daily.  5. Norvasc 7.5 mg daily.  6. Aspirin 325 mg daily.  7. Plavix 75 mg daily.      Stephanie Acre Dominick, PA      P. Liliane Bade, M.D.  Electronically Signed    KMD/MEDQ  D:  04/07/2006  T:  04/07/2006  Job:  [id number]   cc:   Balinda Quails, M.D.

## 2010-06-09 NOTE — Discharge Summary (Signed)
NAMERAYSHA, TILMON                 ACCOUNT NO.:  [id number]   MEDICAL RECORD NO.:  [id number]          PATIENT TYPE:  INP   LOCATION:  2024                         FACILITY:  [facility]   PHYSICIAN:  Balinda Quails, M.D.    DATE OF BIRTH:  Feb 28, 1915   DATE OF ADMISSION:  04/01/2006  DATE OF DISCHARGE:                               DISCHARGE SUMMARY   ADMISSION DIAGNOSIS:  Abdominal aortic aneurysm.   DISCHARGE DIAGNOSES:  1. Abdominal aortic aneurysm, status post repair.  2. Postoperative acute blood loss anemia.  3. Hypertension.  4. Hyperlipidemia.  5. Coronary artery disease.  6. Renal insufficiency.  7. Chronic anemia.  8. Nonsustained ventricular tachycardia.  9. Status post right coronary artery stenting with Taxus stenting in      July of 2006,   PROCEDURE:  April 01, 2006:  Patient underwent laparotomy and lysis of  adhesions; repair of infrarenal abdominal aortic aneurysm 16 mm straight  aortic tube graft by Dr. Liliane Bade.   CONSULTS:  None.   HISTORY AND PHYSICAL:  This is an 77 year old female with a history of  coronary artery disease, status post drug-eluting stent placement.  She  has completed 1 year of Plavix post-procedure.  The patient was known to  have abdominal aortic aneurysm.  The most recent measurements revealed  this to be 5.2 cm in maximum diameter.  She has been free of any  symptoms related to this.  She denies significant abdominal pain or back  pain.  Please see dictated history and physical for further details.   HOSPITAL COURSE:  The patient underwent repair of abdominal aortic  aneurysm of April 01, 2006 by Dr. Madilyn Fireman without any complications.  The  patient was then transferred to the SICU.  The patient was extubated  without any difficulty.  She had been receiving breath treatments.  Currently, she is 94% O2 on 2 liters of oxygen, this will be weaned.  She is doing her incentive spirometry appropriately.   The patient's Plavix and aspirin  were restarted on postoperative day #1.  On postoperative day two, she did have acute blood loss anemia with  hemoglobin of 7.8 and hematocrit 23 and she was transfused packed red  blood cells.  The patient's hemoglobin and hematocrit did raise  appropriately to 9.9 and 28.9.  She has remained stable since.   On postoperative day three, the patient was still not having any flatus  and she was continued NPO.  She did have a bowel movement and flatus on  postoperative day four.  She was started on clear liquids.  Her diet  will be increased as tolerated.  She does have bowel sounds times 4 and  stomach is soft and nontender.  The incisions are clear, dry and intact  and healing appropriately.   The patient does have a history of hypertension.  She was restarted on  her Norvasc on postoperative day #4.  This will be monitored closely.  She was ambulating with physical therapy.  She will have home physical  therapy after discharge to help with reconditioning.  Her  renal function  has remained intact with a BUN of 13, creatinine 1.1.   DISPOSITION:  The patient will be discharged home in the next 2-3 days  provided her labs remain stable and she is able to tolerate increase in  her diet.   MEDICATIONS:  1. Tylox 20, 2 tabs every 4 hours p.r.n.  2. Niacin 1200 mg 2 tabs p.o. b.i.d.  3. Lovastatin 20 mg p.o. daily.  4. Lasix 20 mg p.o. daily.  5. Norvasc 5 mg p.o. daily.  6. Aspirin 325 mg p.o. daily.  7. Plavix 75 mg p.o. daily.   DISCHARGE INSTRUCTIONS:  The patient was instructed to follow a low  fat/low salt diet.  No driving, heavy lifting.  The patient is  ambulatory and increase activity as tolerated.  The patient may shower  and clean incisions with mild soap and water.  She is to call the office  if any problems should arise such as incision erythema, drainage or  temperature greater than 101.5.   FOLLOW UP:  The patient is to follow up with Dr. Madilyn Fireman in 3 weeks.  Office to  call with time and date of appointment.  She is to have a  staple line removal appointment in 2 weeks, office again will contact  her with time and date of appointment.   DICTATED BY:  Constance Holster, PA      P. Liliane Bade, M.D.  Electronically Signed     PGH/MEDQ  D:  04/05/2006  T:  04/06/2006  Job:  [id number]

## 2010-06-09 NOTE — H&P (Signed)
NAME:  Eileen Cruz, HEISER                           ACCOUNT NO.:  [id number]   MEDICAL RECORD NO.:  [id number]                   PATIENT TYPE:  INP   LOCATION:  5709                                 FACILITY:  [facility]   PHYSICIAN:  Leonie Man, M.D.                DATE OF BIRTH:  08-11-1915   DATE OF ADMISSION:  04/22/2003  DATE OF DISCHARGE:                                HISTORY & PHYSICAL   ADMISSION DIAGNOSIS:  Cellulitis of the right foot with an ulcer at the  second medial toe with sinus tract down to the bone.   HISTORY:  The patient is an 77 year old lady who noted a callus on her  second toe of her right foot.  She removed the callus to remove a draining  ulcer.  She was started on doxycycline by her doctor and then referred to [place]  for care.   PAST SURGICAL HISTORY:  1. Total abdominal hysterectomy.  2. Cesarean section.  3. Excision of a tumor of her right shoulder.   ALLERGIES:  No known drug allergies, but does have a sensitivity to beta  blockers.   PAST MEDICAL HISTORY:  1. History of congestive heart failure in the remote past.  2. She also has a history of glaucoma, for which she takes Xalatan.  3. History of hypertension, for which she takes doxazosin and     hydrochlorothiazide.  4. She also takes Caltrate and Ocu-Vite supplements.   REVIEW OF SYMPTOMS:  Negative in detail, except as outlined above.   PHYSICAL EXAMINATION:  GENERAL:  This is a well-developed and well-  nourished, elderly female.  VITAL SIGNS:  She is afebrile with normal vital signs.  HEENT:  PERRLA, EOMI, no thyromegaly, no JVD, no adenopathy.  LUNGS:  Clear bilaterally to auscultation.  HEART:  Regular rate and rhythm.  No murmurs, rubs or gallops.  ABDOMEN:  Nontender and nondistended.  There is no visceromegaly or masses.  EXTREMITIES:  Osteomyelitis and cellulitis of the right foot with  significant edema and erythema of the dorsum of the foot.  NEUROLOGIC:  The second toe of the right  foot is insensate.   ASSESSMENT:  1. Right foot infection cellulitis, probable osteomyelitis of the second     toe.  2. History of congestive heart failure.  3. History of glaucoma.  4. History of hypertension.   PLAN:  Elevation and antibiotics and eventual amputation of the second toe  of the right foot.                                                Leonie Man, M.D.    PB/MEDQ  D:  04/26/2003  T:  04/26/2003  Job:  [id number]   cc:   Leonie Man, M.D.  [phone]  [address]  [address]  [place]  [place] [address]  Fax: [phone]

## 2010-06-09 NOTE — Op Note (Signed)
NAMEDHANVI, BOESEN NO.:  [id number]   MEDICAL RECORD NO.:  [id number]          PATIENT TYPE:  INP   LOCATION:  2303                         FACILITY:  [facility]   PHYSICIAN:  Balinda Quails, M.D.    DATE OF BIRTH:  05/07/1915   DATE OF PROCEDURE:  04/01/2006  DATE OF DISCHARGE:                               OPERATIVE REPORT   SURGEON:  P Bud Face, MD   ASSISTANT:  Fabienne Bruns, MD; Jerold Coombe, P.A.   ANESTHETIC:  General endotracheal.   ANESTHESIOLOGIST:  Dr. Katrinka Blazing.   PREOPERATIVE DIAGNOSIS:  1. 5.7 cm infrarenal abdominal aortic aneurysm.   POSTOPERATIVE DIAGNOSES:  1. 5.7 cm infrarenal abdominal aortic aneurysm.  2. Intra-abdominal adhesions.   PROCEDURE:  1. Laparotomy, lysis of adhesions.  2. Repair of infrarenal abdominal aortic aneurysm with 16-mm straight      aortic tube graft.   CLINICAL NOTE:  Mrs. Bywater is an 77 year old female with history of  coronary disease, status post drug-eluting stent placement.  She has  completed 1 year of Plavix post procedure, is now brought to the  operating room for repair of an infrarenal abdominal aortic aneurysm.  She has a history of previous hysterectomy.   OPERATIVE PROCEDURE:  The patient brought to the operating room in  stable condition.  Placed in supine position.  General endotracheal  anesthesia induced.  Foley catheter, arterial line and Swan-Ganz  catheter are in place.  In supine position the abdomen both legs prepped  and draped in sterile fashion.   Midline skin incision carried down from the xiphoid to the pubis.  Dissection carried through subcutaneous tissue with electrocautery.  Linea alba incised.  The peritoneal cavity entered in the upper portion  of the abdomen.  This was free of adhesions.  Peritoneum was divided up  to the xiphoid.  Below the umbilicus.  There were dense adhesions to the  anterior abdominal wall.  The fascia was very carefully incised.  The  omentum  was then dissected off of the right lower quadrant anterior  abdominal wall.  Small bowel was noted to be adhesed to the sigmoid  colon.  Loops of small bowel were taken off the sigmoid colon.  There  were also loops of small bowel adhesed down to the pelvis at the site of  previous hysterectomy and the adhesions to the dome of the bladder.  The  small bowel was mobilized with sharp dissection.  Complete lysis of  adhesions took approximately 30 minutes to allow exposure of the  retroperitoneum.   Full laparotomy evaluation carried out.  The liver and gallbladder were  unremarkable.  Bile ducts, normal.  Pancreas soft.  Large bowel revealed  no masses.  Small bowel revealed no abnormalities.  The stomach and  duodenum unremarkable.   Small-bowel retracted the right, transverse colon brought superiorly.  The infrarenal aorta examined, found to have a AAA consistent with 5.5  to 6 cm in size.  The retroperitoneum incised along the body of the  aneurysm sac.  The origin of the inferior mesenteric artery was freed  and encircled with fine vessel loop.  Proximal dissection carried up to  the left renal vein which was mobilized and retracted superiorly.  Downward traction placed on the infrarenal aorta.  The neck of the  aneurysm cleared up to the renal artery origin bilaterally.  The patient  was previously noted to have a left renal artery stenosis, however,  there was a small left kidney present which was quite atrophic.   The retroperitoneum incised down to the common iliac origin bilaterally.  The common iliac artery was dissected out and encircled with vessel  loops bilaterally.   The patient administered 25 grams mannitol intravenously, 5000 units  heparin intravenously.   The infrarenal aorta controlled with an aortic DeBakey clamp.  The  common iliac arteries bilaterally controlled with coarctation clamps.  The infrarenal aorta opened longitudinally with electrocautery.   Laminated thrombus removed from the aneurysm sac.  Brisk backbleeding  noted from the inferior mesenteric artery which was ligated with 2-0  silk tie.  Backbleeding lumbar vessels controlled for figure-of-eight 2-  0 silk suture.   The aneurysm sac opened up to the infrarenal neck.  The neck was short,  however, there was an adequate neck for grafting infrarenally.  A 16-mm  straight aortic graft was anastomosed end-to-end to the infrarenal aorta  using running 3-0 Prolene suture.  At completion of this, proximal  anastomosis tested.  There was some bleeding from the right lateral  aspect of the anastomosis which was controlled with interrupted 4-0  Prolene suture.   This graft then brought down to the aortic bifurcation.  The aorta  divided at the bifurcation.  The graft divided and anastomosed end-to-  end to the infrarenal aorta at the bifurcation using running 3-0 Prolene  suture.  At completion of the distal anastomosis, graft was flushed, the  iliac vessels back flushed.  Clamps were then removed.  Excellent flow  present down into the legs and feet reperfused easily.  A short period  of hypotension present which responded readily to graft compression and  volume.   The patient administered 50 mg protamine intravenously.  Adequate  hemostasis obtained.  Sponge and instrument counts correct.   The aneurysm sac closed over the graft with running 2-0 Vicryl suture.  The retroperitoneum closed with running 3-0 Vicryl suture.   The midline fascia then closed with running #1 PDS suture.  The  subcutaneous tissue loosely reapproximated with interrupted 3-0 Vicryl  suture.  Skin closed with staples.  Sterile dressings applied.   The patient tolerated procedure well.  No apparent complications.  Transferred to recovery in stable condition.      Balinda Quails, M.D.  Electronically Signed    PGH/MEDQ  D:  04/01/2006  T:  04/02/2006  Job:  [id number]

## 2010-06-09 NOTE — Discharge Summary (Signed)
NAME:  SHAKIYA, MCNEARY                           ACCOUNT NO.:  [id number]   MEDICAL RECORD NO.:  [id number]                   PATIENT TYPE:  INP   LOCATION:  5709                                 FACILITY:  [facility]   PHYSICIAN:  Leonie Man, M.D.                DATE OF BIRTH:  1915-09-29   DATE OF ADMISSION:  04/22/2003  DATE OF DISCHARGE:  04/29/2003                                 DISCHARGE SUMMARY   ADMISSION DIAGNOSIS:  Osteomyelitis of second toe, right foot.   DISCHARGE DIAGNOSIS:  Osteomyelitis of second toe, right foot.   PROCEDURES IN HOSPITAL:  Ray amputation second toe right foot.   COMPLICATIONS:  None.   CONDITION ON DISCHARGE:  Improved.   HISTORY OF PRESENT ILLNESS:  Ms. Labine is an 77 year old lady with a callus  on the base of the second toe of the right foot.  She removed this and  developed a cellulitis and was started on doxycycline and then referred for  dare on admission with plain films.  The patient was noted to have some  osteomyelitis.   She was admitted to the hospital for control of her infection and then taken  to the operating room where she underwent a ray amputation of the second toe  of her right foot.   Her postoperative course was benign.  She is recovering well.  She is being  discharged to follow up in the office in two weeks.   DISCHARGE MEDICATIONS:  1. Cephalexin 500 mg t.i.d.  2. Vicodin one every four hours p.r.n.   ACTIVITY:  Ad lib.   DIET:  As tolerated.                                                Leonie Man, M.D.    PB/MEDQ  D:  06/22/2003  T:  06/22/2003  Job:  [id number]

## 2010-06-09 NOTE — Cardiovascular Report (Signed)
NAME:  Eileen Cruz, Eileen Cruz NO.:  [id number]   MEDICAL RECORD NO.:  [id number]          PATIENT TYPE:  INP   LOCATION:  5506                         FACILITY:  [facility]   PHYSICIAN:  Charlies Constable, M.D. [facility] DATE OF BIRTH:  1915-02-12   DATE OF PROCEDURE:  08/09/2004  DATE OF DISCHARGE:                              CARDIAC CATHETERIZATION   CLINICAL HISTORY:  Ms. Gutman is 77 years old but has been very active and in  good health.  She has a history of hypertension.  She was admitted with  chest pain and then she had a CT scan of the chest which showed no pulmonary  emboli but did show a 5.3 cm aneurysm in her abdomen.  She had a troponin of  0.06.  She underwent diagnostic angiography by Dr. Gala Romney and was found  to have a total occlusion of the right coronary artery.  We were not sure if  this was acute or chronic or if it was acute on top of chronic.  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  There was no major obstruction in the left coronary  system, and she had well-developed collaterals to the right coronary artery.  After reviewing the cines, Dr. Gala Romney and I felt that an attempt at  opening the right coronary artery was indicated.   PROCEDURE:  The procedure was performed via the right femoral artery using  arterial sheath and a 6 [place] JR4 guiding catheter with side holes.  The  patient was initially given weight-adjusted heparin to prolong the ACT to  greater than 200 seconds.  We tried initially to cross the lesion with a PT2  light support wire and then a Whisper wire.  The hydrophilic wires were  unable to cross.  We then tried to cross with a long [employer] 4.5.  We used a 2.0 x 20 mm Maverick over-the-wire balloon to help with support.  This also was unable to cross.  We then switched to a __________ and with  the __________ and the help of balloon support, we were able to cross the  lesion into the distal vessel.  We slid the balloon over the wire and did a  distal injection to document that we were in the true lumen.  We then  dilated with a 2.0 x 20 mm Maverick with three inflations up to 12  atmospheres for 30 seconds at the region of the total occlusion.  We then  predilated with a 2.5 x 20 mm Maverick and performed two inflations up to 10  atmospheres for 30 seconds.  We then deployed a 2.75 x 24 mm Taxus stent.  We deployed this in the proximal vessel even though we planned to stent the  proximal to midvessel as well.  We felt we would have difficulty getting a  second stent into a distal stent and that is why we stented the proximal  area first.  We deployed this with one inflation of 17 atmospheres for 30  seconds.  We then deployed  a second 2.75 x 24 mm Taxus stent overlapping the  first stent by about 3-4 mm and covering the lesions that extended down into  the midvessel.  This was deployed with one inflation up to 16 atmospheres  for 30 seconds.  We then postdilated both stents with a 3.25 x 20 mm Quantum  Maverick, performing three inflations up to 18 atmospheres for 30 seconds.  Final diagnostic studies were then performed through the guiding catheter.  The patient tolerated the procedure well and left the laboratory in  satisfactory condition.   RESULTS:  Initially the vessel was totally occluded in its proximal portion.  Following stenting, the stenosis improved to less than 10%.  The distal  vessel consisted of posterior descending and a moderate and a very large  posterolateral branch, which was free of major obstruction.   CONCLUSION:  Successful stenting of the totally occluded lesion in the  proximal right coronary artery (probably acute on chronic) with tandem  overlapping Taxus drug-eluting stents with improvement in sentinel narrowing  from 100% to less than 10%.   DISPOSITION:  The patient was returned to the post anesthesia unit for   further observation.       BB/MEDQ  D:  08/09/2004  T:  08/10/2004  Job:  [id number]   cc:   Alvester Morin, M.D.  [address]  [place]  [place] [address]  Fax: [phone]   Arvilla Meres, M.D. [facility]   Rollene Rotunda, M.D.  [address]  [address]  [place]  [place] [address]   Cardiopulmonary Lab

## 2010-06-09 NOTE — Cardiovascular Report (Signed)
NAMEAMYRAH, PINKHASOV                 ACCOUNT NO.:  [id number]   MEDICAL RECORD NO.:  [id number]          PATIENT TYPE:  INP   LOCATION:  6524                         FACILITY:  [facility]   PHYSICIAN:  Arvilla Meres, M.D. LHCDATE OF BIRTH:  07-03-1915   DATE OF PROCEDURE:  08/09/2004  DATE OF DISCHARGE:                              CARDIAC CATHETERIZATION   PRIMARY CARE PHYSICIAN:  Dr. Jeannetta Nap.   CARDIOLOGIST:  Dr. Antoine Poche.   PATIENT IDENTIFICATION:  Ms. Greiner is a delightful 77 year old woman with a  history of hypertension and recently discovered abdominal aortic aneurysm  who was admitted with the acute onset of back and neck pain accompanied with  shortness of breath. She underwent spiral CT which revealed no evidence of  pulmonary emboli but a large abdominal aortic aneurysm 5.2 cm. The troponin  was borderline positive at 0.06 and her CK-MBs were negative. EKG was  nonacute. She was referred for diagnostic cardiac catheterization. Of note,  she does report a similar episode several years ago which she did not  undergo cardiac workup for.   PROCEDURES PERFORMED:  1.  Selective coronary angiography.  2.  Left heart catheterization.   DESCRIPTION OF PROCEDURE:  The risks and benefits of the catheterization  were explained to Ms. Granja; consent was signed and placed on the chart.  Right groin area was prepped and draped in routine sterile fashion. The  artery was cannulated; however, we are unable to pass the wire easily on the  first several attempts. Finally on the third attempt with the use of a  Wooley wire, we passed the wire into the distal aorta. This was markedly  tortuous. All catheter exchanges were made over a high wire approach.   Angiography revealed an anomalous left circumflex which showed a came off  the right coronary cusp. The LAD was imaged with a JL-4. The RCA was imaged  with a JR-4 and the anomalous left circumflex was imaged nonselectively but  adequately  with LCB catheter was left coronary bypass catheter. As stated  above, all catheter exchanges were made over wire. There were no apparent  complications. Total dye used was 170 cc of Visipaque.   PRESSURES:  Central aortic pressure was 151/66 with mean of 98. LV pressure  was 175/15/19. There is no significant pullback across the aortic valve.   FINDINGS:  LAD: This was a moderate size vessel which bifurcated in the mid  section. The lateral branch was the larger of the two branches. There was  some ostial calcium but the catheter did not dampen. There was no evidence  of flow-limiting stenosis ostially. There was diffuse irregularities  throughout the proximal portion of the vessel with a tubular 30% lesion.  There was some minor irregularities in the distal vessel but no flow-  limiting stenoses. There was also a large septal perforator which supplied  collaterals to the distal RCA.   Circumflex: This had anomalous origin off the right coronary cusp. It was  never engaged selectively but was shot nonselectively. It gave off a large  OM1 and the distal AV groove circumflex  was diminutive. There was a 30%  tubular stenosis in the proximal to midportion of the left circumflex.  Otherwise, there was no significant stenoses. There was evidence of  collaterals from the OM1 to the distal RCA.   A right coronary artery was heavily diseased proximally and then totally  occluded in the proximal section with clot. There is faint filling of the  distal vessel which appear to be coming from retrograde flow from the  anomalous left circumflex.   Left ventriculogram was done secondary to decreased creatinine clearance.   ASSESSMENT:  1.  Nonobstructive coronary disease in the left anterior descending artery      and anomalous left circumflex.  2.  Totally occluded proximal right coronary artery with clot as well as      left-to-right collaterals.   DISCUSSION:  Given her acute presentation  without significant EKG changes  and only very mildly elevated at troponin level, I suspect that her proximal  occlusion of the RCA is acute on chronic problem. I have reviewed the films  with Dr. Juanda Chance an we will plan for attempted at PCI and stenting of the  proximally occluded right coronary artery.       DB/MEDQ  D:  08/09/2004  T:  08/09/2004  Job:  [id number]

## 2010-06-09 NOTE — Discharge Summary (Signed)
NAMEALEXANDERIA, Eileen Cruz                 ACCOUNT NO.:  [id number]   MEDICAL RECORD NO.:  [id number]          PATIENT TYPE:  INP   LOCATION:  6524                         FACILITY:  [facility]   PHYSICIAN:  Alvester Morin, M.D.  DATE OF BIRTH:  [birth year]   DATE OF ADMISSION:  08/06/2004  DATE OF DISCHARGE:  08/10/2004                                 DISCHARGE SUMMARY   DISCHARGE DIAGNOSES:  1.  Obstructive coronary artery disease.  2.  Nonsustained ventricular tachycardia.  3.  Hypertension.  4.  Anemia.  5.  Renal insufficiency.   DISCHARGE MEDICATIONS:  1.  Plavix 75 mg p.o. daily.  2.  Aspirin 325 mg p.o. daily.  3.  Lasix 40 mg p.o. daily.  4.  Norvasc 5 mg p.o. daily.  5.  Nitroglycerin 0.4 mg sublingual p.r.n.   DISPOSITION:  To follow up with Dr. Antoine Poche for regular monitoring of  abdominal aortic aneurysm and also carotid Dopplers for carotid bruit; to  follow up with Dr. Jeannetta Nap who is the primary care physician on August 14, 2004, at 7:45 a.m. regarding monitoring of creatinine levels as the patient  has been sent home on Norvasc 5 mg and Lasix 40 mg. The patient has also had  a how low hemoglobin levels which are to be monitored by Dr. Jeannetta Nap.  Hemoccult was negative here in the hospital.   CONSULTATIONS:  Dr. Antoine Poche, cardiologist.   PROCEDURES:  Cardiac catheterization was done on August 09, 2004, and showed  totally occluded proximal RCA with clot, with left to right collaterals.  Stent was placed in RCA.   IMAGES DONE THIS ADMISSION:  Chest x-ray done on August 06, 2004, showed  stable cardiomegaly, no acute lung disease. CT of chest done on August 06, 2004, showed no spiral CT evidence for acute pulmonary embolus. Emphysema  was present and cardiomegaly. A very large hiatal hernia containing nearly  the entire stomach. An abdominal aortic aneurysm which measured 4.6 x 5.4 cm  with a prominent mural thrombus.   HISTORY OF PRESENT ILLNESS:  This is an 77 year old woman  with a past  medical history of hypertension  presenting to the ED by EMS with complaints  of back pain that is between her shoulder blades, left arm pain, and pain in  her left jaw. The pain started all of a sudden at around 7 a.m. It was 5/10,  not increasing in intensity, lasted 15 minutes, and was associated with  sweating. The patient took an aspirin and called EMS. No history of chest  pain or palpitations. However, she was slightly short of breath. No history  of nausea, vomiting, or abdominal pain. She has a history of diarrhea  alternating with constipation, however no colonoscopy has been done on her  recently.   ALLERGIES:  The patient says that she has an adverse reaction to BETA  BLOCKER, but an exact reaction is not known.   PAST MEDICAL HISTORY:  1.  Amputated right second toe secondary to cellulitis.  2.  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  3.  Excision of tumor in the right shoulder done in 1968/06/16 which was shown to      be benign.  4.  Glaucoma in 2003/06/17.  5.  Bilateral cataracts operated on with lens placed in 17-Jun-1991.  6.  Inguinal/femoral hernia operated on in Jun 17, 1958 and her last mammogram was      in 06-17-2003.   MEDICATIONS:  1.  Doxazosin, which is actually Cardura, 8 mg daily p.o. daily.  2.  HCTZ  25 mg p.o. daily.  3.  Aspirin 325 mg p.r.n.  4.  Vitamin D daily.  5.  Multivitamin tablets daily.   SOCIAL HISTORY:  The patient was a former smoker; however she has quit  smoking in the year Jun 16, 1980. She does not drink alcohol and has no history of  cocaine or IV drug use.  The patient is widowed and higher education is to  the 11th grade. 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 However,  she is retired. She has Medicare Complete and she lives alone.   FAMILY HISTORY:  Her mother died at childbirth. Her father had lung cancer  who is also deceased. She has one brother who is alive and healthy. She has  three  children, one who has had a history of heart attacks, another one who  had a cardiac catheterization in 06/16/2000, and another one who died in a motor  vehicle accident.   REVIEW OF SYSTEMS:  She has fatigue. She is hard of hearing on the right  side, right ear. She has arthritis.   PHYSICAL EXAMINATION:  VITAL SIGNS: Pulse 60, blood pressure 162/66,  temperature 98, respirations 20, O2 saturation 94% on room air.  GENERAL: The patient did not appear in any acute distress.  HEENT: Pupils equal, round, and reactive. Extraocular movements were intact.  Oropharynx was clear. Moist mucosa.  NECK: No lymphadenopathy. No JVD, however there was a carotid bruit.  LUNGS: Crackles were present in the base of the lung bilaterally with no  wheezes or rhonchi.  HEART: Normal rate and rhythm. No murmurs, rubs, or gallops.  ABDOMEN: Soft, obese, nontender. Bowel sounds are present.  EXTREMITIES: Slight pedal edema is present. No rashes or petechia on the  skin.  EXTREMITIES: There is some musculoskeletal deformities of the toes  bilaterally and she also has scoliosis.  NEUROLOGIC: She was alert and oriented times three. Strength was 5/5  bilaterally and symmetric. Tone normal bilaterally and symmetric. Sensations  were intact to pinprick bilaterally. Cranial nerves II-XII are intact. Deep  tendon reflexes are 2+ bilaterally.   LABORATORY DATA:  Sodium 145, potassium 5.0, chloride 107, bicarbonate 31,  BUN 23, creatinine 1.1, glucose 94, hemoglobin 12.0, hematocrit 37, MCV  84.4, white blood cell count 5.4, ANC 3.5, platelet count 141,000. Total  bilirubin 0.9, alkaline phosphatase 2, SGOT 23, SGPT 17, total protein 6.7,  albumin 3.6, calcium 9.3. PT 13.6, INR 1.0, PTT 32.  Urinalysis was  negative. First set of cardiac markers revealed myoglobin 184, CK-MB 2.0,  troponin-I less than 0.05.   EKG showed nonspecific T-wave changes. The EKG was repeated twice and it  showed pretty much the  same.  HOSPITAL COURSE:  PROBLEM #1:  Obstructive coronary artery disease. The  patient came in with a history of back pain between her shoulders, left jaw  pain, and left arm pain. Cardiac enzymes showed elevation in the troponin  levels. EKG showed nonspecific T-wave changes and a 2-D echo showed left  ventricular systolic function normal and left ejection fraction of 55% to  65%. The left ventricular wall was moderately increased in size. Cardiology  consult was called and a catheterization was suggested. The patient had  catheterization with stent placement on August 09, 2004. The catheterization  showed a totally occluded proximal right coronary artery with clots, with  left to right collaterals, stent placed in RCA. The patient was placed on  Plavix. Abdominal aortic aneurysm was seen on CT chest which is about 5.5 cm  in size. She was to be monitored regularly as an outpatient with Dr.  Antoine Poche.   PROBLEM #2:  Nonsustained ventricular tachycardia secondary to underlying  coronary artery disease. Magnesium and TSH levels were normal, which was  very concerning keeping in mind her aortic aneurysm.   PROBLEM #3:  Hypertension. Blood pressure on admission was 152/66.  It was  very concerning as we had just found abdominal aortic aneurysm at the size  of 5.5. BP was monitored and kept under control with Norvasc 5 mg and Lasix.  Her home medications of HCTZ and doxazosin were discontinued due to her  borderline creatinine levels.   PROBLEM #4:  Anemia. Hemoglobin levels were low during hospitalization.  Baseline hemoglobin is unknown. On discharge her hemoglobin level was 7.4.  Hemoccult was negative. Serum ferritin levels were normal. Therefore cause  for anemia is unknown.   PROBLEM #5:  Chronic renal insufficiency could have been secondary to post  procedure or Lasix. Creatinine levels to be monitored as an outpatient with  Dr. Jeannetta Nap her primary care physician.   DISCHARGE  LABORATORY DATA:  Sodium 138, potassium 4.3, chloride 97,  bicarbonate 35, BUN 26, creatinine 1.4, glucose 92, hemoglobin 11.4,  hematocrit 34.0, MCV 84.6, white blood cell count 5.2, platelet count  152,000. CK 107, CK-MB 3.2.   DISCHARGE VITAL SIGNS:  Temperature 98.6, blood pressure 130/550, pulse 60,  respirations 20, O2 saturation 97% on two liters.      Sange   SS/MEDQ  D:  08/11/2004  T:  08/12/2004  Job:  [id number]   cc:   Windle Guard, M.D.  [address]  [place], [place] [address]  Fax: [phone]   Rollene Rotunda, M.D.  [address]  [address]  [place]  [place] [address]

## 2010-06-09 NOTE — Op Note (Signed)
Eileen, Cruz                 ACCOUNT NO.:  [id number]   MEDICAL RECORD NO.:  [id number]          PATIENT TYPE:  AMB   LOCATION:  SDS                          FACILITY:  [facility]   PHYSICIAN:  Balinda Quails, M.D.    DATE OF BIRTH:  [birth year]   DATE OF PROCEDURE:  01/08/2006  DATE OF DISCHARGE:                               OPERATIVE REPORT   PHYSICIAN:  Denman George, MD.   DIAGNOSIS:  A 5.5 cm abdominal aortic aneurysm.   PROCEDURE:  Abdominal aortogram with pelvic runoff arteriography.   ACCESS:  Right common femoral artery 5-French sheath.   CONTRAST:  130 mL Visipaque.   COMPLICATIONS:  None apparent.   CLINICAL NOTE:  Eileen Cruz is a 77 year old female with a known  abdominal aortic aneurysm.  Surgery for this has been delayed due to  placement of a Taxus coronary stent.  She is now well beyond her high  risk period for thrombosis of the stent.  She is brought to the cath lab  at this time for further workup of her AAA with the abdominal  aortography.   PROCEDURE NOTE:  The patient brought to the cath lab in stable  condition.  Placed in supine position.  Both groins prepped and draped  in sterile fashion.  Skin and subcutaneous tissues instilled with 1%  Xylocaine.  A needle easily introduced in right common femoral artery.  A 0.035 J-wire passed through the needle into the mid abdominal aorta.  The needle removed, 5-French sheath advanced over the guidewire.  The  dilator removed and the sheath flushed with heparin and saline solution.  A graduated pigtail catheter was then advanced over guidewire to the mid  abdominal aorta.  Standard AP mid abdominal aortogram obtained.  This  revealed a juxtarenal abdominal aortic aneurysm.  The aneurysm tapered  at the level of the common iliac arteries bilaterally.  There was  evidence of a high-grade left renal artery stenosis.  Right renal artery  appeared widely patent.  Renal arteries were single bilaterally.   The  pigtail catheter was advanced further and a lateral aortogram  obtained.  This revealed patency of the superior mesenteric and celiac  arteries.   A magnified view of the juxtarenal aorta was then obtained.  This, in  the AP projection, revealed high-grade stenosis of the left renal  artery.  Right renal artery appeared to be widely patent.   The pigtail catheter was then brought down to the aortic bifurcation and  bilateral oblique pelvic arteriograms were obtained.  These verified  patency of the common hypogastric and external iliac arteries  bilaterally.  Normal flow to the level of the common femoral artery  bilaterally.   There were no apparent complications during the procedure.  Guidewire  reinserted, pigtail catheter removed.  Right femoral sheath removed.  The patient transferred to the holding area in stable condition.   FINAL IMPRESSION:  1. A 5.5 cm juxtarenal aortic aneurysm.  2. High-grade left renal artery stenosis.  3. Normal caliber common iliac arteries bilaterally.  4. Intact flow to the common  femoral level bilaterally.   DISPOSITION:  These results have been reviewed with the patient and  family.  She will be scheduled to undergo open operative repair of her  abdominal aortic aneurysm along with planned left renal artery bypass.      Balinda Quails, M.D.  Electronically Signed     PGH/MEDQ  D:  01/08/2006  T:  01/09/2006  Job:  [id number]

## 2010-06-09 NOTE — Op Note (Signed)
NAME:  Eileen Cruz, Eileen Cruz                           ACCOUNT NO.:  [id number]   MEDICAL RECORD NO.:  [id number]                   PATIENT TYPE:  INP   LOCATION:  5709                                 FACILITY:  [facility]   PHYSICIAN:  Leonie Man, M.D.                DATE OF BIRTH:  11-09-1915   DATE OF PROCEDURE:  04/26/2003  DATE OF DISCHARGE:                                 OPERATIVE REPORT   PREOPERATIVE DIAGNOSIS:  Osteomyelitis, second toe, right foot.   POSTOPERATIVE DIAGNOSIS:  Osteomyelitis, second toe, right foot.   PROCEDURE:  Ray amputation, second toe, right foot.   SURGEON:  Leonie Man, M.D.   ASSISTANT:  Nurse.   ANESTHESIA:  Popliteal block by Kaylyn Layer. Michelle Piper, M.D.   NOTE:  The patient is an 77 year old lady presenting with an ulcer  penetrating down to bone on the medial aspect of her second toe of her right  foot.  The toe is insensate and on x-rays this shows osteomyelitis.  She  comes to the operating room now after three days of antibiotic therapy for  ray amputation.   PROCEDURE:  Following the induction of satisfactory general anesthesia, the  patient is positioned supinely and the foot is prepped and draped to be  included in the sterile operative field.  A tennis racquet-type incision was  made around the toe, carrying the long handle up along the dorsum of the  foot overlying the metatarsal.  Dissection was carried down around the toe,  taking the toe at the metatarsophalangeal and dissecting upward along  approximately the midshaft metatarsal.  The surrounding tendons were then  drawn taut and amputated.  The periosteum along the metatarsal was shaved  back and the bone cutter was used to cut and to transect the metatarsal.  The remaining attachments to the toe were then freed and the toe was removed  and forwarded for pathologic evaluation.  Cultures of the ulcer and  ulcerated area were again done.  The cut edge of the bone was then rongeured  back.   Bone wax was used to close the marrow.  Hemostasis was obtained with  electrocautery.  The wound was then thoroughly irrigated with multiple  aliquots of saline.  A 3/4 inch Penrose drain was placed in the wound.  The  wound was closed with interrupted sutures of 3-0 nylon.  Sterile dressings  were applied, the anesthetic reversed, and the patient removed from the  operating room to the recovery room in stable condition.  She tolerated the  procedure well.                                               Leonie Man, M.D.    PB/MEDQ  D:  04/26/2003  T:  04/27/2003  Job:  [id number]

## 2010-06-09 NOTE — Assessment & Plan Note (Signed)
[facility]                            CARDIOLOGY OFFICE NOTE   Eileen Cruz, Eileen Cruz                        MRN:          [id number]  DATE:02/26/2006                            DOB:          April 12, 1915    PRIMARY CARE PHYSICIAN:  Windle Guard, M.D.   REASON FOR PRESENTATION:  Evaluate patient with coronary artery disease.   HISTORY OF PRESENT ILLNESS:  The patient is now 77 years old.  She is  quite lovely.  She has coronary artery disease as described below.  She  also has an abdominal aortic aneurysm.  She had a recent evaluation by  Dr. Balinda Quails apparently and was told that she is not a candidate  for a stent repair of her aneurysm.  She is to be scheduled for an open  repair.  She comes here for followup and for preoperative clearance.  She presented in 2006, with jaw and shoulder discomfort predominantly as  her anginal equivalent.  Since having this treated, she has had no  further symptoms.  She remains active in her home, doing all her  housework, including vacuuming.  With this she denies any chest, jaw or  shoulder discomfort.  She has had no shortness of breath and denies any  exercise-associated nausea, vomiting or diaphoresis.  She has had no  palpitations, no syncope or pre-syncope.  She denies any PND or  orthopnea.   PAST MEDICAL HISTORY:  1. Coronary artery disease.  (No major obstruction in the left      coronary artery system.  None in the circumflex and the right      coronary cusp.  A totally occluded right coronary artery      collaterals.  She underwent A Taxus stenting on August 09, 2004.  The      ejection fraction was 55%.)  2. Renal insufficiency.  3. Non-sustained tachycardia.  4. Abdominal aortic aneurysm.  5. Left renal artery stenosis with atrophy of the left kidney.  6. Hiatal hernia.  7. Hypertension.  8. Anemia.  9. Amputated right great toe, secondary to cellulitis.  10.Total abdominal hysterectomy for  suspected endometrial carcinoma.  11.Benign tumor resected from the right shoulder in 1970.  12.Glaucoma.  13.Bilateral cataract surgery.  14.Inguinal femoral hernia.   ALLERGIES:  The patient has been intolerant to BETA BLOCKERS.   MEDICATIONS:  1. Plavix 75 mg daily.  2. Lasix 20 mg daily.  3. Norvasc 5 mg daily.  4. Aspirin 325 mg daily.  5. Niacin immediate release, over-the-counter, 400 mg daily.  6. Lovastatin 20 mg daily.   REVIEW OF SYSTEMS:  As stated in the HPI and otherwise negative for all  other systems.   PHYSICAL EXAMINATION:  GENERAL:  The patient is in no distress.  VITAL SIGNS:  Blood pressure 183/77, heart rate 77 and regular, weight  177 pounds, body mass index 34.  HEENT:  Eyes unremarkable.  Pupils equal, round, reactive to light.  Fundi not visualized.  Oral mucosa unremarkable.  NECK:  No jugular venous distention at 45 degrees.  Carotid upstroke  brisk  and symmetrical, no bruits, no thyromegaly.  LYMPHATICS:  No cervical, axillary or inguinal adenopathy.  LUNGS:  Clear to auscultation bilaterally.  BACK:  No costovertebral angle tenderness.  Kyphoscoliosis.  CHEST:  Unremarkable.  HEART:  PMI not displaced or sustained.  S1 and S2 within normal limits.  No S3, no S4, no clicks, no rubs, no murmurs.  ABDOMEN:  Flat, with positive bowel sounds.  Normal in frequency and  pitch.  No bruits, no guarding, no rebound, no midline pulsatile mass,  no hepatomegaly, no splenomegaly.  SKIN:  No rashes.  EXTREMITIES:  Upper 2+ pulses, right greater than left lower extremity  mild edema, with 1+ bilateral dorsalis pedis and posterior tibialis.  No  cyanosis or clubbing.  NEUROLOGIC:  Oriented to person, place and time.  Cranial nerves II-XII  grossly intact.  Motor grossly intact.   Electrocardiogram:  Sinus rhythm.  Rate 67.  Left axis deviation.  Left  anterior fascicular block.  Inferolateral T-wave inversion, unchanged  from previous electrocardiograms.    ASSESSMENT/PLAN:  1. Coronary artery disease:  The patient is having no symptoms      consistent with her previous angina.  She has a moderately-high      functional level.  Based on this, no further cardiovascular testing      is suggested before her surgery.  I would put her on beta blockers,      but she has been intolerant with bradyarrhythmias in the past.  She      should remain on the medications, with the adjustment listed below.      She can stop her Plavix prior to the surgery, per Dr. Balinda Quails,  She and I will discuss whether to restart this going      forward.  2. Abdominal aortic aneurysm:  As above.  3. Hypertension:  I am going to increase her Norvasc to 7.5 mg daily.   FOLLOWUP:  I will see her back here in one year, but would be happy to  be involved with her hospitalization.     Rollene Rotunda, MD, [facility]  Electronically Signed    JH/MedQ  DD: 02/26/2006  DT: 02/26/2006  Job #: [id number]   cc:   Windle Guard, M.D.  Balinda Quails, M.D.

## 2010-10-11 ENCOUNTER — Telehealth: Payer: Self-pay | Admitting: Cardiology

## 2010-10-11 MED ORDER — CLOPIDOGREL BISULFATE 75 MG PO TABS: 75.0000 mg | ORAL_TABLET | Freq: Every day | ORAL | Status: DC

## 2010-10-11 NOTE — Telephone Encounter (Signed)
Pt calling needing refill of Plavix 75 mg called into [facility]. Please call in RX refill request for pt.

## 2011-05-14 ENCOUNTER — Other Ambulatory Visit: Payer: Self-pay | Admitting: Cardiology

## 2011-05-14 MED ORDER — CLOPIDOGREL BISULFATE 75 MG PO TABS: 75.0000 mg | ORAL_TABLET | Freq: Every day | ORAL | Status: DC

## 2011-05-14 NOTE — Telephone Encounter (Signed)
Pt is out of pills 

## 2011-05-14 NOTE — Telephone Encounter (Signed)
.   Requested Prescriptions   Pending Prescriptions Disp Refills  . clopidogrel (PLAVIX) 75 MG tablet 30 tablet 3    Sig: Take 1 tablet (75 mg total) by mouth daily.   

## 2011-05-15 ENCOUNTER — Telehealth: Payer: Self-pay | Admitting: Cardiology

## 2011-05-15 NOTE — Telephone Encounter (Signed)
New msg Pt wants to talk to you again. Please call her back

## 2011-05-15 NOTE — Telephone Encounter (Signed)
Fu call °Pt calling back again °

## 2011-06-07 ENCOUNTER — Ambulatory Visit: Payer: Self-pay | Admitting: Cardiology

## 2011-06-11 ENCOUNTER — Ambulatory Visit (INDEPENDENT_AMBULATORY_CARE_PROVIDER_SITE_OTHER): Payer: Medicare Other | Admitting: Cardiology

## 2011-06-11 ENCOUNTER — Encounter: Payer: Self-pay | Admitting: Cardiology

## 2011-06-11 VITALS — BP 172/87 | HR 67 | Ht 59.0 in | Wt 175.8 lb

## 2011-06-11 DIAGNOSIS — I2510 Atherosclerotic heart disease of native coronary artery without angina pectoris: Secondary | ICD-10-CM

## 2011-06-11 DIAGNOSIS — R0989 Other specified symptoms and signs involving the circulatory and respiratory systems: Secondary | ICD-10-CM

## 2011-06-11 DIAGNOSIS — I10 Essential (primary) hypertension: Secondary | ICD-10-CM

## 2011-06-11 NOTE — Assessment & Plan Note (Signed)
The blood pressure is at target. No change in medications is indicated. We will continue with therapeutic lifestyle changes (TLC).  

## 2011-06-11 NOTE — Assessment & Plan Note (Signed)
The patient has no new sypmtoms.  No further cardiovascular testing is indicated.  We will continue with aggressive risk reduction and meds as listed.  

## 2011-06-11 NOTE — Patient Instructions (Signed)
The current medical regimen is effective;  continue present plan and medications.  Your physician has requested that you have a carotid duplex. This test is an ultrasound of the carotid arteries in your neck. It looks at blood flow through these arteries that supply the brain with blood. Allow one hour for this exam. There are no restrictions or special instructions.  Follow up in 18 months with Dr Antoine Poche.  You will receive a letter in the mail 2 months before you are due.  Please call us when you receive this letter to schedule your follow up appointment.

## 2011-06-11 NOTE — Assessment & Plan Note (Signed)
I will check Dopplers as she would want surgery if she was found to have high grade disease.

## 2011-06-11 NOTE — Progress Notes (Signed)
HPI The patient presents after not being seen for over three years.  She has a history of coronary disease as described below. Since I last saw her she was hospitalized for pneumonia a couple of years ago. She's otherwise had no major health issues and no cardiac complaints. She lives now with her son. She ambulates with a walker.  The patient denies any new symptoms such as chest discomfort, neck or arm discomfort. There has been no new shortness of breath, PND or orthopnea. There have been no reported palpitations, presyncope or syncope.   Allergies  Allergen Reactions  . Beta Adrenergic Blockers     REACTION: bradycardia    Current Outpatient Prescriptions  Medication Sig Dispense Refill  . albuterol (PROVENTIL HFA;VENTOLIN HFA) 108 (90 BASE) MCG/ACT inhaler Inhale 2 puffs into the lungs every 4 (four) hours as needed.      Marland Kitchen AMLODIPINE BESYLATE PO Take 5 mg by mouth daily.       Marland Kitchen aspirin 325 MG tablet Take 325 mg by mouth daily.      . clopidogrel (PLAVIX) 75 MG tablet Take 1 tablet (75 mg total) by mouth daily.  30 tablet  3  . furosemide (LASIX) 40 MG tablet Take 40 mg by mouth daily.      Marland Kitchen lovastatin (MEVACOR) 20 MG tablet Take 20 mg by mouth at bedtime.      . Multiple Vitamin (MULTIVITAMIN) tablet Take 1 tablet by mouth daily.      . niacin 100 MG tablet Take 100 mg by mouth daily with breakfast. 2 tabs daily        Past Medical History  Diagnosis Date  . Coronary artery disease     No major obstruction in the left system.  100 RCA occlusion treated with a Taxus stent July 2006  . Hypertension   . Renal insufficiency   . CHF (congestive heart failure)   . Stenosis of aortic valve   . Abdominal aortic aneurysm   . Renal artery stenosis   . Glaucoma   . Cataract   . Rhinitis   . Hypoxemia     Past Surgical History  Procedure Date  . Angioplasty     with stent  . Hysterotomy   . Tubal ligation   . Appendectomy   . Cataract surgery   . Hernia repair   . Tumor  removal   . Abdominal aortic aneurysm repair     Family History  Problem Relation Age of Onset  . Cancer Father   . Coronary artery disease Brother 40    History   Social History  . Marital Status: Widowed    Spouse Name: N/A    Number of Children: 3  . Years of Education: N/A   Occupational History  . Not on file.   Social History Main Topics  . Smoking status: Former Smoker    Quit date: 06/10/1981  . Smokeless tobacco: Not on file  . Alcohol Use: Not on file  . Drug Use: Not on file  . Sexually Active: Not on file   Other Topics Concern  . Not on file   Social History Narrative   Lives with son.      ROS:   As stated in the HPI and negative for all other systems.   PHYSICAL EXAM BP 172/87  Pulse 67  Ht 4\' 11"  (1.499 m)  Wt 175 lb 12.8 oz (79.742 kg)  BMI 35.51 kg/m2 GEN:  No distress NECK:  No  jugular venous distention at 90 degrees, waveform within normal limits, carotid upstroke brisk and symmetric,bilateral bruits, no thyromegaly LYMPHATICS:  No cervical adenopathy LUNGS:  Clear to auscultation bilaterally BACK:  No CVA tenderness CHEST:  Unremarkable HEART:  S1 and S2 within normal limits, no S3, no S4, no clicks, no rubs, soft apical systolic murmurs ABD:  Positive bowel sounds normal in frequency in pitch, no bruits, no rebound, no guarding, unable to assess midline mass or bruit with the patient seated. EXT:  2 plus pulses throughout,mild edema, no cyanosis no clubbing SKIN:  No rashes no nodules NEURO:  Cranial nerves II through XII grossly intact, motor grossly intact throughout [facility]:  Cognitively intact, oriented to person place and time   EKG:  Sinus rhythm, axis within normal limits, intervals within normal limits, rate 67, nonspecific T-wave flattening. 06/11/2011  ASSESSMENT AND PLAN

## 2011-06-20 ENCOUNTER — Encounter (INDEPENDENT_AMBULATORY_CARE_PROVIDER_SITE_OTHER): Payer: Medicare Other

## 2011-06-20 DIAGNOSIS — R0989 Other specified symptoms and signs involving the circulatory and respiratory systems: Secondary | ICD-10-CM

## 2011-06-20 DIAGNOSIS — I6529 Occlusion and stenosis of unspecified carotid artery: Secondary | ICD-10-CM

## 2011-06-26 ENCOUNTER — Ambulatory Visit: Payer: Self-pay | Admitting: Cardiology

## 2011-09-05 ENCOUNTER — Other Ambulatory Visit: Payer: Self-pay | Admitting: Cardiology

## 2011-09-05 MED ORDER — CLOPIDOGREL BISULFATE 75 MG PO TABS: 75.0000 mg | ORAL_TABLET | Freq: Every day | ORAL | Status: DC

## 2012-01-09 ENCOUNTER — Other Ambulatory Visit (HOSPITAL_COMMUNITY): Payer: Self-pay | Admitting: Emergency Medicine

## 2012-01-09 ENCOUNTER — Ambulatory Visit (HOSPITAL_COMMUNITY)
Admission: RE | Admit: 2012-01-09 | Discharge: 2012-01-09 | Disposition: A | Discharge status: Home / Self Care | Payer: Medicare Other | Source: Ambulatory Visit | Attending: Emergency Medicine | Admitting: Emergency Medicine

## 2012-01-09 DIAGNOSIS — I2510 Atherosclerotic heart disease of native coronary artery without angina pectoris: Secondary | ICD-10-CM | POA: Insufficient documentation

## 2012-01-09 DIAGNOSIS — M79609 Pain in unspecified limb: Secondary | ICD-10-CM | POA: Insufficient documentation

## 2012-01-09 DIAGNOSIS — R609 Edema, unspecified: Secondary | ICD-10-CM | POA: Insufficient documentation

## 2012-01-09 DIAGNOSIS — R52 Pain, unspecified: Secondary | ICD-10-CM

## 2012-01-09 DIAGNOSIS — I10 Essential (primary) hypertension: Secondary | ICD-10-CM | POA: Insufficient documentation

## 2012-01-09 DIAGNOSIS — R0902 Hypoxemia: Secondary | ICD-10-CM

## 2012-01-09 DIAGNOSIS — Z9861 Coronary angioplasty status: Secondary | ICD-10-CM | POA: Insufficient documentation

## 2012-01-09 NOTE — Progress Notes (Signed)
Bilateral:  No evidence of DVT or superficial thrombosis.  There appears to be a Baker's cyst.

## 2012-01-24 ENCOUNTER — Emergency Department (HOSPITAL_COMMUNITY)
Admission: EM | Admit: 2012-01-24 | Discharge: 2012-01-24 | Disposition: A | Discharge status: Home / Self Care | Payer: Medicare Other | Attending: Emergency Medicine | Admitting: Emergency Medicine

## 2012-01-24 ENCOUNTER — Encounter (HOSPITAL_COMMUNITY): Payer: Self-pay | Admitting: *Deleted

## 2012-01-24 DIAGNOSIS — Z7982 Long term (current) use of aspirin: Secondary | ICD-10-CM | POA: Insufficient documentation

## 2012-01-24 DIAGNOSIS — I10 Essential (primary) hypertension: Secondary | ICD-10-CM | POA: Insufficient documentation

## 2012-01-24 DIAGNOSIS — Z87891 Personal history of nicotine dependence: Secondary | ICD-10-CM | POA: Insufficient documentation

## 2012-01-24 DIAGNOSIS — Z87448 Personal history of other diseases of urinary system: Secondary | ICD-10-CM | POA: Insufficient documentation

## 2012-01-24 DIAGNOSIS — I509 Heart failure, unspecified: Secondary | ICD-10-CM | POA: Insufficient documentation

## 2012-01-24 DIAGNOSIS — Z8709 Personal history of other diseases of the respiratory system: Secondary | ICD-10-CM | POA: Insufficient documentation

## 2012-01-24 DIAGNOSIS — Z8679 Personal history of other diseases of the circulatory system: Secondary | ICD-10-CM | POA: Insufficient documentation

## 2012-01-24 DIAGNOSIS — I2510 Atherosclerotic heart disease of native coronary artery without angina pectoris: Secondary | ICD-10-CM | POA: Insufficient documentation

## 2012-01-24 DIAGNOSIS — Z8669 Personal history of other diseases of the nervous system and sense organs: Secondary | ICD-10-CM | POA: Insufficient documentation

## 2012-01-24 DIAGNOSIS — R209 Unspecified disturbances of skin sensation: Secondary | ICD-10-CM | POA: Insufficient documentation

## 2012-01-24 DIAGNOSIS — R202 Paresthesia of skin: Secondary | ICD-10-CM

## 2012-01-24 DIAGNOSIS — Z79899 Other long term (current) drug therapy: Secondary | ICD-10-CM | POA: Insufficient documentation

## 2012-01-24 LAB — CBC WITH DIFFERENTIAL/PLATELET
Basophils Absolute: 0 10*3/uL (ref 0.0–0.1)
Basophils Relative: 1 % (ref 0–1)
Eosinophils Absolute: 0.2 10*3/uL (ref 0.0–0.7)
HCT: 41 % (ref 36.0–46.0)
Hemoglobin: 12.8 g/dL (ref 12.0–15.0)
MCH: 27.5 pg (ref 26.0–34.0)
MCHC: 31.2 g/dL (ref 30.0–36.0)
Monocytes Absolute: 0.4 10*3/uL (ref 0.1–1.0)
Monocytes Relative: 8 % (ref 3–12)
Neutrophils Relative %: 56 % (ref 43–77)
RDW: 14.3 % (ref 11.5–15.5)

## 2012-01-24 LAB — BASIC METABOLIC PANEL
BUN: 28 mg/dL — ABNORMAL HIGH (ref 6–23)
Creatinine, Ser: 1.4 mg/dL — ABNORMAL HIGH (ref 0.50–1.10)
GFR calc Af Amer: 36 mL/min — ABNORMAL LOW (ref >90)
GFR calc non Af Amer: 31 mL/min — ABNORMAL LOW (ref >90)
Glucose, Bld: 132 mg/dL — ABNORMAL HIGH (ref 70–99)
Potassium: 4.3 mEq/L (ref 3.5–5.1)

## 2012-01-24 MED ORDER — GABAPENTIN 300 MG PO CAPS: 300.0000 mg | ORAL_CAPSULE | Freq: Every day | ORAL | Status: DC

## 2012-01-24 NOTE — ED Provider Notes (Signed)
History     CSN: [id number]  Arrival date & time 01/24/12  0620   First MD Initiated Contact with Patient 01/24/12 0719      Chief Complaint  Patient presents with  . Leg Pain    (Consider location/radiation/quality/duration/timing/severity/associated sxs/prior treatment) Patient is a 79 y.o. female presenting with leg pain. The history is provided by the patient and a relative.  Leg Pain  Associated symptoms comments: She has had a burning sensation in both legs, right greater than left, for several weeks. Seen here on 01/07/13 and had negative lower extremity dopplers. This morning she woke with the burning sensation all over her body and became concerned she was having a reaction to the antitbiotic. No shortness of breath or difficulty swallowing. She has been on Cipro since the 23rd. She states that symptoms have completely resolved now, with the exception of continued burning in lower legs as before..    Past Medical History  Diagnosis Date  . Coronary artery disease     No major obstruction in the left system.  100 RCA occlusion treated with a Taxus stent July 2006  . Hypertension   . Renal insufficiency   . CHF (congestive heart failure)   . Stenosis of aortic valve   . Abdominal aortic aneurysm   . Renal artery stenosis   . Glaucoma(365)   . Cataract   . Rhinitis   . Hypoxemia     Past Surgical History  Procedure Date  . Angioplasty     with stent  . Hysterotomy   . Tubal ligation   . Appendectomy   . Cataract surgery   . Hernia repair   . Tumor removal   . Abdominal aortic aneurysm repair     Family History  Problem Relation Age of Onset  . Cancer Father   . Coronary artery disease Brother 25    History  Substance Use Topics  . Smoking status: Former Smoker    Quit date: 06/10/1981  . Smokeless tobacco: Not on file  . Alcohol Use: No    OB History    Grav Para Term Preterm Abortions TAB SAB Ect Mult Living                  Review of Systems   Constitutional: Negative for fever and chills.  HENT: Negative.  Negative for facial swelling, trouble swallowing and voice change.   Respiratory: Negative.  Negative for shortness of breath.   Cardiovascular: Negative.   Gastrointestinal: Negative.  Negative for nausea and abdominal pain.  Skin: Negative.  Negative for rash.  Neurological: Negative.  Negative for light-headedness.  Psychiatric/Behavioral: Negative for confusion.    Allergies  Beta adrenergic blockers  Home Medications   Current Outpatient Rx  Name  Route  Sig  Dispense  Refill  . AMLODIPINE BESYLATE 5 MG PO TABS   Oral   Take 5 mg by mouth daily.         . ASPIRIN 325 MG PO TABS   Oral   Take 325 mg by mouth daily.         . CEPHALEXIN 500 MG PO CAPS   Oral   Take 500 mg by mouth 4 (four) times daily.         Marland Kitchen CIPROFLOXACIN HCL 500 MG PO TABS   Oral   Take 500 mg by mouth 2 (two) times daily.         Marland Kitchen CLOPIDOGREL BISULFATE 75 MG PO TABS   Oral  Take 1 tablet (75 mg total) by mouth daily.   30 tablet   5   . FUROSEMIDE 40 MG PO TABS   Oral   Take 40 mg by mouth daily.         Marland Kitchen LOVASTATIN 20 MG PO TABS   Oral   Take 20 mg by mouth at bedtime.         Marland Kitchen NIACIN 100 MG PO TABS   Oral   Take 100 mg by mouth daily with breakfast. 2 tabs daily         . ALBUTEROL SULFATE HFA 108 (90 BASE) MCG/ACT IN AERS   Inhalation   Inhale 2 puffs into the lungs every 4 (four) hours as needed.           BP 132/53  Pulse 58  Temp 97.6 F (36.4 C) (Oral)  Resp 18  SpO2 96%  Physical Exam  Constitutional: She appears well-developed and well-nourished.  HENT:  Head: Normocephalic.  Neck: Normal range of motion. Neck supple.  Cardiovascular: Normal rate and regular rhythm.   No murmur heard. Pulmonary/Chest: Effort normal and breath sounds normal. She has no wheezes. She has no rales.  Abdominal: Soft. Bowel sounds are normal. There is no tenderness. There is no rebound and no  guarding.  Musculoskeletal: Normal range of motion. She exhibits no edema.       Lower extremities without swelling or pitting. FROM. Non-tender. Chronic valgus deformities bilateral 1st MTP joints.  Neurological: She is alert. No cranial nerve deficit.  Skin: Skin is warm and dry. No rash noted.  Psychiatric: She has a normal mood and affect.    ED Course  Procedures (including critical care time)  Labs Reviewed  BASIC METABOLIC PANEL - Abnormal; Notable for the following:    Glucose, Bld 132 (*)     BUN 28 (*)     Creatinine, Ser 1.40 (*)     GFR calc non Af Amer 31 (*)     GFR calc Af Amer 36 (*)     All other components within normal limits  CBC WITH DIFFERENTIAL - Abnormal; Notable for the following:    Platelets 81 (*)  PLATELET COUNT CONFIRMED BY SMEAR   All other components within normal limits  CK   No results found.   No diagnosis found.  1. Paresthesias.  MDM  Records reviewed - negative doppler studies negative for DVT. Patient's symptoms resolved. Discussed symptoms as relates to Cipro with pharmacy, doubt medication related. Advised to continue medication.        Arnoldo Hooker, PA-C 01/24/12 1610  Arnoldo Hooker, PA-C 02/07/12 1919

## 2012-01-24 NOTE — ED Notes (Signed)
PT states she has been taking antibiotics for her legs but she woke up this morning with burning sensation all over and was wondering if she was take too much antibiotics. Redness not bilateral lower extremities above the ankle.

## 2012-01-24 NOTE — ED Notes (Signed)
Family at bedside. 

## 2012-01-24 NOTE — ED Notes (Signed)
Pt was seen recently for swelling in her legs. Pt was given a DVT study which came back negative. Pt given 2 antibiotics for cellulitis. Pt states that this morning she woke up and was having a burning sensation over her whole body but it then settled in her legs. Pt states feel like sunburn.

## 2012-02-08 NOTE — ED Provider Notes (Signed)
Medical screening examination/treatment/procedure(s) were performed by non-physician practitioner and as supervising physician I was immediately available for consultation/collaboration.    Nelia Shi, MD 02/08/12 2030

## 2012-03-11 ENCOUNTER — Other Ambulatory Visit: Payer: Self-pay

## 2012-03-11 MED ORDER — CLOPIDOGREL BISULFATE 75 MG PO TABS: 75.0000 mg | ORAL_TABLET | Freq: Every day | ORAL | Status: DC

## 2012-07-08 ENCOUNTER — Encounter (INDEPENDENT_AMBULATORY_CARE_PROVIDER_SITE_OTHER): Payer: Medicare Other

## 2012-07-08 DIAGNOSIS — I6529 Occlusion and stenosis of unspecified carotid artery: Secondary | ICD-10-CM

## 2012-09-03 ENCOUNTER — Other Ambulatory Visit: Payer: Self-pay

## 2012-09-03 MED ORDER — CLOPIDOGREL BISULFATE 75 MG PO TABS: 75.0000 mg | ORAL_TABLET | Freq: Every day | ORAL | Status: DC

## 2012-12-25 ENCOUNTER — Encounter: Payer: Self-pay | Admitting: Cardiology

## 2012-12-25 ENCOUNTER — Ambulatory Visit (INDEPENDENT_AMBULATORY_CARE_PROVIDER_SITE_OTHER): Payer: Medicare Other | Admitting: Cardiology

## 2012-12-25 VITALS — BP 179/82 | HR 72 | Ht 59.0 in | Wt 171.0 lb

## 2012-12-25 DIAGNOSIS — I2510 Atherosclerotic heart disease of native coronary artery without angina pectoris: Secondary | ICD-10-CM

## 2012-12-25 NOTE — Progress Notes (Signed)
HPI The patient presents for follow up of coronary disease as described below. Since I last saw her she has done well.    She ambulates with a walker.  The patient denies any new symptoms such as chest discomfort, neck or arm discomfort. There has been no new shortness of breath, PND or orthopnea. There have been no reported palpitations, presyncope or syncope.  She moved out of her son's home and she lives by herself again.     Allergies  Allergen Reactions  . Beta Adrenergic Blockers     REACTION: bradycardia    Current Outpatient Prescriptions  Medication Sig Dispense Refill  . albuterol (PROVENTIL HFA;VENTOLIN HFA) 108 (90 BASE) MCG/ACT inhaler Inhale 2 puffs into the lungs every 4 (four) hours as needed.      Marland Kitchen amLODipine (NORVASC) 5 MG tablet Take 5 mg by mouth daily.      Marland Kitchen aspirin 325 MG tablet Take 325 mg by mouth daily.      . clopidogrel (PLAVIX) 75 MG tablet Take 1 tablet (75 mg total) by mouth daily.  30 tablet  5  . furosemide (LASIX) 40 MG tablet Take 40 mg by mouth daily.      Marland Kitchen lovastatin (MEVACOR) 20 MG tablet Take 20 mg by mouth at bedtime.      . niacin 100 MG tablet Take 100 mg by mouth daily with breakfast. 2 tabs daily       No current facility-administered medications for this visit.    Past Medical History  Diagnosis Date  . Coronary artery disease     No major obstruction in the left system.  100 RCA occlusion treated with a Taxus stent July 2006  . Hypertension   . Renal insufficiency   . CHF (congestive heart failure)   . Stenosis of aortic valve   . Abdominal aortic aneurysm   . Renal artery stenosis   . Glaucoma   . Cataract   . Rhinitis   . Hypoxemia     Past Surgical History  Procedure Laterality Date  . Angioplasty      with stent  . Hysterotomy    . Tubal ligation    . Appendectomy    . Cataract surgery    . Hernia repair    . Tumor removal    . Abdominal aortic aneurysm repair      ROS:   As stated in the HPI and negative for  all other systems.   PHYSICAL EXAM BP 179/82  Pulse 72  Ht 4\' 11"  (1.499 m)  Wt 171 lb (77.565 kg)  BMI 34.52 kg/m2 GEN:  No distress NECK:  No jugular venous distention at 90 degrees, waveform within normal limits, carotid upstroke brisk and symmetric,bilateral bruits, no thyromegaly LYMPHATICS:  No cervical adenopathy LUNGS:  Clear to auscultation bilaterally BACK:  No CVA tenderness CHEST:  Unremarkable HEART:  S1 and S2 within normal limits, no S3, no S4, no clicks, no rubs, soft apical systolic murmurs ABD:  Positive bowel sounds normal in frequency in pitch, no bruits, no rebound, no guarding, unable to assess midline mass or bruit with the patient seated. EXT:  2 plus pulses throughout,mild edema, no cyanosis no clubbing    EKG:  Sinus rhythm, axis within normal limits, intervals within normal limits, rate 72, nonspecific T-wave flattening. 12/25/2012  ASSESSMENT AND PLAN  CAD:   The patient has no new sypmtoms.  No further cardiovascular testing is indicated.  We will continue with aggressive risk reduction  and meds as listed.  I do think that she has done well on combination therapy and she did get a first generation DES. Therefore, I would continue this.  HTN:  She is going to get a blood pressure cuff for Christmas and keep a diary. She'll let me know if it's running about 160 systolic.  BRUIT:  I will arrange followup carotid Doppler for when I see her in 2015.

## 2012-12-25 NOTE — Patient Instructions (Signed)
The current medical regimen is effective;  continue present plan and medications.  Follow up in 1 year with Dr Hochrein.  You will receive a letter in the mail 2 months before you are due.  Please call us when you receive this letter to schedule your follow up appointment.  

## 2013-02-11 ENCOUNTER — Other Ambulatory Visit: Payer: Self-pay

## 2013-02-11 MED ORDER — CLOPIDOGREL BISULFATE 75 MG PO TABS: 75.0000 mg | ORAL_TABLET | Freq: Every day | ORAL | Status: DC

## 2013-02-15 ENCOUNTER — Emergency Department (HOSPITAL_COMMUNITY)
Admission: EM | Admit: 2013-02-15 | Discharge: 2013-02-15 | Disposition: A | Discharge status: Home / Self Care | Payer: Medicare Other | Attending: Emergency Medicine | Admitting: Emergency Medicine

## 2013-02-15 ENCOUNTER — Encounter (HOSPITAL_COMMUNITY): Payer: Self-pay | Admitting: Emergency Medicine

## 2013-02-15 DIAGNOSIS — I10 Essential (primary) hypertension: Secondary | ICD-10-CM | POA: Insufficient documentation

## 2013-02-15 DIAGNOSIS — H409 Unspecified glaucoma: Secondary | ICD-10-CM | POA: Insufficient documentation

## 2013-02-15 DIAGNOSIS — Y9389 Activity, other specified: Secondary | ICD-10-CM | POA: Insufficient documentation

## 2013-02-15 DIAGNOSIS — W19XXXA Unspecified fall, initial encounter: Secondary | ICD-10-CM

## 2013-02-15 DIAGNOSIS — IMO0002 Reserved for concepts with insufficient information to code with codable children: Secondary | ICD-10-CM | POA: Insufficient documentation

## 2013-02-15 DIAGNOSIS — R609 Edema, unspecified: Secondary | ICD-10-CM | POA: Insufficient documentation

## 2013-02-15 DIAGNOSIS — Y929 Unspecified place or not applicable: Secondary | ICD-10-CM | POA: Insufficient documentation

## 2013-02-15 DIAGNOSIS — Z87891 Personal history of nicotine dependence: Secondary | ICD-10-CM | POA: Insufficient documentation

## 2013-02-15 DIAGNOSIS — Z9861 Coronary angioplasty status: Secondary | ICD-10-CM | POA: Insufficient documentation

## 2013-02-15 DIAGNOSIS — Z8709 Personal history of other diseases of the respiratory system: Secondary | ICD-10-CM | POA: Insufficient documentation

## 2013-02-15 DIAGNOSIS — I509 Heart failure, unspecified: Secondary | ICD-10-CM | POA: Insufficient documentation

## 2013-02-15 DIAGNOSIS — Z79899 Other long term (current) drug therapy: Secondary | ICD-10-CM | POA: Insufficient documentation

## 2013-02-15 DIAGNOSIS — Z87448 Personal history of other diseases of urinary system: Secondary | ICD-10-CM | POA: Insufficient documentation

## 2013-02-15 DIAGNOSIS — S80819A Abrasion, unspecified lower leg, initial encounter: Secondary | ICD-10-CM

## 2013-02-15 DIAGNOSIS — W010XXA Fall on same level from slipping, tripping and stumbling without subsequent striking against object, initial encounter: Secondary | ICD-10-CM | POA: Insufficient documentation

## 2013-02-15 DIAGNOSIS — Z7982 Long term (current) use of aspirin: Secondary | ICD-10-CM | POA: Insufficient documentation

## 2013-02-15 DIAGNOSIS — I2510 Atherosclerotic heart disease of native coronary artery without angina pectoris: Secondary | ICD-10-CM | POA: Insufficient documentation

## 2013-02-15 DIAGNOSIS — Z7902 Long term (current) use of antithrombotics/antiplatelets: Secondary | ICD-10-CM | POA: Insufficient documentation

## 2013-02-15 NOTE — ED Notes (Signed)
Pt fell at her house this past Friday, pt has a very small abrasion to her right leg where she seen some clear fluid come from it last night

## 2013-02-15 NOTE — ED Provider Notes (Signed)
CSN: [id number]     Arrival date & time 02/15/13  1222 History   This chart was scribed for non-physician practitioner Emilia BeckKaitlyn Coriana Angello, PA-C, working with Hurman HornJohn M Bednar, MD, by Yevette EdwardsAngela Bracken, ED Scribe. This patient was seen in room TR10C/TR10C and the patient's care was started at 1:47 PM.  None    Chief Complaint  Patient presents with  . Fall  . Leg Swelling    The history is provided by the patient and a relative. No language interpreter was used.    HPI Comments: Eileen Cruz is a 80 y.o. female, with a h/o chronic lower-extremity edema, CHF, renal artery stenosis, and HTN, who presents to the ED complaining of a fall which occurred two days ago. The fall occurred when she was opening the refrigerator door and slipped. The pt denies LOC, head impact, or current pain. She also denies chest pain and SOB. The pt reports an abrasion to her right leg as a consequence of the fall, and she is concerned because of several days of clear fluid which has been draining from the abrasion. She recently stopped taking Lasix per her doctor's orders. She also has a h/o hospitalized for cellulitis. [name] is a former smoker.   Past Medical History  Diagnosis Date  . Coronary artery disease     No major obstruction in the left system.  100 RCA occlusion treated with a Taxus stent July 2006  . Hypertension   . Renal insufficiency   . CHF (congestive heart failure)   . Stenosis of aortic valve   . Abdominal aortic aneurysm   . Renal artery stenosis   . Glaucoma   . Cataract   . Rhinitis   . Hypoxemia    Past Surgical History  Procedure Laterality Date  . Angioplasty      with stent  . Hysterotomy    . Tubal ligation    . Appendectomy    . Cataract surgery    . Hernia repair    . Tumor removal    . Abdominal aortic aneurysm repair     Family History  Problem Relation Age of Onset  . Cancer Father   . Coronary artery disease Brother [id number]   History  Substance Use Topics  . Smoking  status: Former Smoker    Quit date: 06/10/1981  . Smokeless tobacco: Not on file  . Alcohol Use: No   No OB history provided.  Review of Systems  Respiratory: Negative for shortness of breath.   Cardiovascular: Positive for leg swelling (Baseline). Negative for chest pain.  Skin: Positive for wound.  All other systems reviewed and are negative.   Allergies  Beta adrenergic blockers  Home Medications   Current Outpatient Rx  Name  Route  Sig  Dispense  Refill  . albuterol (PROVENTIL HFA;VENTOLIN HFA) 108 (90 BASE) MCG/ACT inhaler   Inhalation   Inhale 2 puffs into the lungs every 4 (four) hours as needed.         Marland Kitchen. amLODipine (NORVASC) 5 MG tablet   Oral   Take 5 mg by mouth daily.         Marland Kitchen. aspirin 325 MG tablet   Oral   Take 325 mg by mouth daily.         . clopidogrel (PLAVIX) 75 MG tablet   Oral   Take 1 tablet (75 mg total) by mouth daily.   30 tablet   5   . furosemide (LASIX) 40 MG tablet  Oral   Take 40 mg by mouth daily.         Marland Kitchen lovastatin (MEVACOR) 20 MG tablet   Oral   Take 20 mg by mouth at bedtime.         . niacin 100 MG tablet   Oral   Take 100 mg by mouth daily with breakfast. 2 tabs daily          Triage Vitals: BP 178/50  Pulse 70  Temp(Src) 97.7 F (36.5 C) (Oral)  Resp 18  Ht 5' (1.524 m)  Wt 174 lb (78.926 kg)  BMI 33.98 kg/m2  SpO2 93%  Physical Exam  Nursing note and vitals reviewed. Constitutional: She is oriented to person, place, and time. She appears well-developed and well-nourished. No distress.  HENT:  Head: Normocephalic and atraumatic.  Eyes: EOM are normal.  Neck: Neck supple. No tracheal deviation present.  Cardiovascular: Normal rate.   Pulmonary/Chest: Effort normal. No respiratory distress.  Musculoskeletal: Normal range of motion. She exhibits edema.  2+ lower extremity edema.   Neurological: She is alert and oriented to person, place, and time.  Skin: Skin is warm and dry.  Small 0.5 by 0.5  superficial wound with clear drainage.   Psychiatric: She has a normal mood and affect. Her behavior is normal.    ED Course  Procedures (including critical care time)  DIAGNOSTIC STUDIES: Oxygen Saturation is 93% on room air, low by my interpretation.    COORDINATION OF CARE:  1:54 PM- Discussed treatment plan with patient, and the patient agreed to the plan. Encouraged the pt to follow-up with her cardiologist. Advised the pt to return to the ED if symptoms worsen.   Labs Review Labs Reviewed - No data to display  Imaging Review No results found.  EKG Interpretation   None       MDM   1. Fall   2. Abrasion of leg     1:59 PM Patient is concerned about the clear fluid leaking from her leg abrasion. Patient denies any injury, SOB, or chest pain. Clear fluid is likely lower extremity edema oozing. Patient advised to follow up with PCP and return to the ED with worsening or concerning symptoms.   I personally performed the services described in this documentation, which was scribed in my presence. The recorded information has been reviewed and is accurate.    Emilia Beck, PA-C 02/15/13 1400

## 2013-02-15 NOTE — ED Notes (Addendum)
Removed dressing from right lower leg. Patient has a pea size red abrasion that has clear fluid oozing from the site. Leg is slightly larger that the other one. Strong palpable pedal pulses. Patient denies hitting her head and no LOC.

## 2013-02-15 NOTE — ED Provider Notes (Signed)
Medical screening examination/treatment/procedure(s) were performed by non-physician practitioner and as supervising physician I was immediately available for consultation/collaboration.  EKG Interpretation   None        Axl Rodino M Annaliese Saez, MD 02/15/13 2033 

## 2013-02-15 NOTE — Discharge Instructions (Signed)
Follow up with your Cardiologist for further evaluation. Return to the ED with worsening or concerning symptoms.

## 2013-02-15 NOTE — ED Notes (Signed)
Family at bedside. 

## 2013-02-16 ENCOUNTER — Telehealth: Payer: Self-pay | Admitting: Cardiology

## 2013-02-16 NOTE — Telephone Encounter (Signed)
New Message  Pt called states she was seen in the hospital and said that they found water build up in her legs// She states that her lasix was taken away from her primary care Dr. Hennie DuosElkin's because of her treatments from gout ( She also believes that the build up is from the medications to treat her gout). She requests a call back to determine if she should resume taking her lasix. Please call back to discuss.

## 2013-02-16 NOTE — Telephone Encounter (Signed)
Advised to call back to PCP to see if she can restart her Furosemide.  Her Furosemide had been stopped by her PCP d/t gout.  Pt states her gout is much better but she is having water coming out of her leg now after obtaining an abrasion from a resent fall.  Advised she should restart it ASAP after checking with PCP since Dr Antoine PocheHochrein is not in this week.  She states understanding.

## 2013-11-27 ENCOUNTER — Other Ambulatory Visit: Payer: Self-pay

## 2013-11-27 MED ORDER — CLOPIDOGREL BISULFATE 75 MG PO TABS: 75.0000 mg | ORAL_TABLET | Freq: Every day | ORAL | Status: DC

## 2014-01-01 ENCOUNTER — Encounter: Payer: Self-pay | Admitting: Cardiology

## 2014-01-01 ENCOUNTER — Ambulatory Visit (INDEPENDENT_AMBULATORY_CARE_PROVIDER_SITE_OTHER): Payer: Medicare Other | Admitting: Cardiology

## 2014-01-01 VITALS — BP 170/82 | HR 55 | Ht 63.0 in | Wt 160.0 lb

## 2014-01-01 DIAGNOSIS — R0989 Other specified symptoms and signs involving the circulatory and respiratory systems: Secondary | ICD-10-CM

## 2014-01-01 DIAGNOSIS — I10 Essential (primary) hypertension: Secondary | ICD-10-CM

## 2014-01-01 DIAGNOSIS — I2510 Atherosclerotic heart disease of native coronary artery without angina pectoris: Secondary | ICD-10-CM

## 2014-01-01 NOTE — Patient Instructions (Signed)
Your physician recommends that you schedule a follow-up appointment in: one year with Dr. Antoine PocheHochrein  We are ordering  A Carotid doppler

## 2014-01-01 NOTE — Progress Notes (Signed)
HPI The patient presents for follow up of coronary disease as described below. Since I last saw her she has done well.    She ambulates with a walker.  The patient denies any new symptoms such as chest discomfort, neck or arm discomfort. There has been no new shortness of breath, PND or orthopnea. There have been no reported palpitations, presyncope or syncope.  She moved out of her son's home and she lives by herself again.     Allergies  Allergen Reactions  . Beta Adrenergic Blockers     REACTION: bradycardia    Current Outpatient Prescriptions  Medication Sig Dispense Refill  . albuterol (PROVENTIL HFA;VENTOLIN HFA) 108 (90 BASE) MCG/ACT inhaler Inhale 2 puffs into the lungs every 4 (four) hours as needed.    Marland Kitchen. allopurinol (ZYLOPRIM) 100 MG tablet Take 100 mg by mouth daily.    Marland Kitchen. amLODipine (NORVASC) 5 MG tablet Take 5 mg by mouth daily.    Marland Kitchen. aspirin 325 MG tablet Take 325 mg by mouth daily.    . clopidogrel (PLAVIX) 75 MG tablet Take 1 tablet (75 mg total) by mouth daily. 30 tablet 5  . lovastatin (MEVACOR) 20 MG tablet Take 20 mg by mouth at bedtime.    . naproxen (NAPROSYN) 500 MG tablet Take 500 mg by mouth 2 (two) times daily with a meal.    . niacin 100 MG tablet Take 100 mg by mouth daily with breakfast. 2 tabs daily    . predniSONE (DELTASONE) 20 MG tablet Take 20 mg by mouth 2 (two) times daily. BID until rash has resolved     No current facility-administered medications for this visit.    Past Medical History  Diagnosis Date  . Coronary artery disease     No major obstruction in the left system.  100 RCA occlusion treated with a Taxus stent July 2006  . Hypertension   . Renal insufficiency   . CHF (congestive heart failure)   . Stenosis of aortic valve   . Abdominal aortic aneurysm   . Renal artery stenosis   . Glaucoma   . Cataract   . Rhinitis   . Hypoxemia     Past Surgical History  Procedure Laterality Date  . Angioplasty      with stent  .  Hysterotomy    . Tubal ligation    . Appendectomy    . Cataract surgery    . Hernia repair    . Tumor removal    . Abdominal aortic aneurysm repair      ROS:   Decreased vision, gout and weak legs.  Otherwise as stated in the HPI and negative for all other systems.   PHYSICAL EXAM BP 170/82 mmHg  Pulse 55  Ht 5\' 3"  (1.6 m)  Wt 160 lb (72.576 kg)  BMI 28.35 kg/m2 GENERAL:  Well appearing and looks great for her age.  HEENT:  Pupils equal round and reactive, fundi not visualized, oral mucosa unremarkable NECK:  No jugular venous distention, waveform within normal limits, carotid upstroke brisk and symmetric, soft left bruit, no thyromegaly LYMPHATICS:  No cervical, inguinal adenopathy LUNGS:  Clear to auscultation bilaterally BACK:  No CVA tenderness CHEST:  Unremarkable HEART:  PMI not displaced or sustained,S1 and S2 within normal limits, no S3, no S4, no clicks, no rubs, no murmurs ABD:  Flat, positive bowel sounds normal in frequency in pitch, no bruits, no rebound, no guarding, no midline pulsatile mass, no hepatomegaly, no splenomegaly EXT:  2 plus pulses throughout, no edema, no cyanosis no clubbing SKIN:  No rashes no nodules NEURO:  Cranial nerves II through XII grossly intact, motor grossly intact throughout PSYCH:  Cognitively intact, oriented to person place and time   EKG:  Sinus rhythm, axis within normal limits, intervals within normal limits, rate 55, nonspecific T-wave flattening.  No change from previous.   01/01/2014  ASSESSMENT AND PLAN  CAD:   The patient has no new sypmtoms.  No further cardiovascular testing is indicated.  We will continue with aggressive risk reduction and meds as listed.  I do think that she has done well on combination therapy and she did get a first generation DES. Therefore, I would continue this.  I discussed this with her again this year.   HTN:  She did get a blood pressure cuff for Christmas and keeps a diary. Her BP are running OK  at home.  I will not change her meds.   CAROTID STENOSIS:  I will arrange followup carotid Doppler.  She had 40-59% stenosis on the right and 1-59% of the left in 2014.

## 2014-01-11 ENCOUNTER — Ambulatory Visit (HOSPITAL_COMMUNITY)
Admission: RE | Admit: 2014-01-11 | Discharge: 2014-01-11 | Disposition: A | Discharge status: Home / Self Care | Payer: Medicare Other | Source: Ambulatory Visit | Attending: Cardiology | Admitting: Cardiology

## 2014-01-11 DIAGNOSIS — I779 Disorder of arteries and arterioles, unspecified: Secondary | ICD-10-CM

## 2014-01-11 DIAGNOSIS — R0989 Other specified symptoms and signs involving the circulatory and respiratory systems: Secondary | ICD-10-CM | POA: Diagnosis not present

## 2014-01-11 NOTE — Progress Notes (Signed)
Carotid Duplex Completed. Eileen Cruz, BS, RDMS, RVT  

## 2014-01-31 ENCOUNTER — Emergency Department (HOSPITAL_COMMUNITY): Payer: Medicare Other

## 2014-01-31 ENCOUNTER — Encounter (HOSPITAL_COMMUNITY): Payer: Self-pay | Admitting: Adult Health

## 2014-01-31 ENCOUNTER — Inpatient Hospital Stay (HOSPITAL_COMMUNITY): Payer: Medicare Other

## 2014-01-31 ENCOUNTER — Inpatient Hospital Stay (HOSPITAL_COMMUNITY)
Admission: EM | Admit: 2014-01-31 | Discharge: 2014-02-04 | DRG: 603 | Disposition: A | Discharge status: Skilled Nursing Facility | Payer: Medicare Other | Attending: Internal Medicine | Admitting: Internal Medicine

## 2014-01-31 DIAGNOSIS — L03119 Cellulitis of unspecified part of limb: Secondary | ICD-10-CM

## 2014-01-31 DIAGNOSIS — J410 Simple chronic bronchitis: Secondary | ICD-10-CM

## 2014-01-31 DIAGNOSIS — W19XXXA Unspecified fall, initial encounter: Secondary | ICD-10-CM | POA: Diagnosis present

## 2014-01-31 DIAGNOSIS — I129 Hypertensive chronic kidney disease with stage 1 through stage 4 chronic kidney disease, or unspecified chronic kidney disease: Secondary | ICD-10-CM | POA: Diagnosis present

## 2014-01-31 DIAGNOSIS — H409 Unspecified glaucoma: Secondary | ICD-10-CM | POA: Diagnosis present

## 2014-01-31 DIAGNOSIS — L03115 Cellulitis of right lower limb: Secondary | ICD-10-CM | POA: Diagnosis not present

## 2014-01-31 DIAGNOSIS — L03116 Cellulitis of left lower limb: Secondary | ICD-10-CM | POA: Diagnosis present

## 2014-01-31 DIAGNOSIS — Z66 Do not resuscitate: Secondary | ICD-10-CM | POA: Diagnosis present

## 2014-01-31 DIAGNOSIS — Z87891 Personal history of nicotine dependence: Secondary | ICD-10-CM | POA: Diagnosis not present

## 2014-01-31 DIAGNOSIS — I2510 Atherosclerotic heart disease of native coronary artery without angina pectoris: Secondary | ICD-10-CM | POA: Diagnosis present

## 2014-01-31 DIAGNOSIS — L0390 Cellulitis, unspecified: Secondary | ICD-10-CM | POA: Diagnosis present

## 2014-01-31 DIAGNOSIS — J449 Chronic obstructive pulmonary disease, unspecified: Secondary | ICD-10-CM | POA: Diagnosis present

## 2014-01-31 DIAGNOSIS — M7730 Calcaneal spur, unspecified foot: Secondary | ICD-10-CM | POA: Diagnosis present

## 2014-01-31 DIAGNOSIS — I509 Heart failure, unspecified: Secondary | ICD-10-CM | POA: Diagnosis present

## 2014-01-31 DIAGNOSIS — I350 Nonrheumatic aortic (valve) stenosis: Secondary | ICD-10-CM | POA: Diagnosis present

## 2014-01-31 DIAGNOSIS — R0902 Hypoxemia: Secondary | ICD-10-CM

## 2014-01-31 DIAGNOSIS — R0602 Shortness of breath: Secondary | ICD-10-CM

## 2014-01-31 DIAGNOSIS — N184 Chronic kidney disease, stage 4 (severe): Secondary | ICD-10-CM | POA: Diagnosis present

## 2014-01-31 DIAGNOSIS — J9611 Chronic respiratory failure with hypoxia: Secondary | ICD-10-CM | POA: Diagnosis present

## 2014-01-31 DIAGNOSIS — J9610 Chronic respiratory failure, unspecified whether with hypoxia or hypercapnia: Secondary | ICD-10-CM | POA: Diagnosis present

## 2014-01-31 DIAGNOSIS — Z8249 Family history of ischemic heart disease and other diseases of the circulatory system: Secondary | ICD-10-CM

## 2014-01-31 LAB — COMPREHENSIVE METABOLIC PANEL
ALT: 17 U/L (ref 0–35)
AST: 26 U/L (ref 0–37)
Albumin: 3.3 g/dL — ABNORMAL LOW (ref 3.5–5.2)
Alkaline Phosphatase: 61 U/L (ref 39–117)
Anion gap: 16 — ABNORMAL HIGH (ref 5–15)
BUN: 29 mg/dL — ABNORMAL HIGH (ref 6–23)
CO2: 31 mmol/L (ref 19–32)
CREATININE: 1.49 mg/dL — AB (ref 0.50–1.10)
Calcium: 9.4 mg/dL (ref 8.4–10.5)
Chloride: 96 mEq/L (ref 96–112)
GFR calc Af Amer: 33 mL/min — ABNORMAL LOW (ref >90)
GFR, EST NON AFRICAN AMERICAN: 28 mL/min — AB (ref >90)
Glucose, Bld: 93 mg/dL (ref 70–99)
Potassium: 4.4 mmol/L (ref 3.5–5.1)
Sodium: 143 mmol/L (ref 135–145)
TOTAL PROTEIN: 7 g/dL (ref 6.0–8.3)
Total Bilirubin: 0.5 mg/dL (ref 0.3–1.2)

## 2014-01-31 LAB — URINALYSIS, ROUTINE W REFLEX MICROSCOPIC
Bilirubin Urine: NEGATIVE
Glucose, UA: NEGATIVE mg/dL
HGB URINE DIPSTICK: NEGATIVE
Ketones, ur: NEGATIVE mg/dL
LEUKOCYTES UA: NEGATIVE
NITRITE: NEGATIVE
PH: 5.5 (ref 5.0–8.0)
PROTEIN: NEGATIVE mg/dL
SPECIFIC GRAVITY, URINE: 1.01 (ref 1.005–1.030)
UROBILINOGEN UA: 0.2 mg/dL (ref 0.0–1.0)

## 2014-01-31 LAB — CBC WITH DIFFERENTIAL/PLATELET
Basophils Absolute: 0 10*3/uL (ref 0.0–0.1)
Basophils Relative: 0 % (ref 0–1)
EOS PCT: 1 % (ref 0–5)
Eosinophils Absolute: 0.1 10*3/uL (ref 0.0–0.7)
HEMATOCRIT: 43.1 % (ref 36.0–46.0)
Hemoglobin: 12.8 g/dL (ref 12.0–15.0)
LYMPHS ABS: 2.2 10*3/uL (ref 0.7–4.0)
LYMPHS PCT: 34 % (ref 12–46)
MCH: 26.7 pg (ref 26.0–34.0)
MCHC: 29.7 g/dL — ABNORMAL LOW (ref 30.0–36.0)
MCV: 90 fL (ref 78.0–100.0)
MONOS PCT: 7 % (ref 3–12)
Monocytes Absolute: 0.5 10*3/uL (ref 0.1–1.0)
NEUTROS PCT: 58 % (ref 43–77)
Neutro Abs: 3.7 10*3/uL (ref 1.7–7.7)
PLATELETS: 213 10*3/uL (ref 150–400)
RBC: 4.79 MIL/uL (ref 3.87–5.11)
RDW: 15.3 % (ref 11.5–15.5)
WBC: 6.5 10*3/uL (ref 4.0–10.5)

## 2014-01-31 LAB — TROPONIN I

## 2014-01-31 LAB — BRAIN NATRIURETIC PEPTIDE: B Natriuretic Peptide: 109.8 pg/mL — ABNORMAL HIGH (ref 0.0–100.0)

## 2014-01-31 LAB — CK: CK TOTAL: 104 U/L (ref 7–177)

## 2014-01-31 LAB — I-STAT CG4 LACTIC ACID, ED: Lactic Acid, Venous: 1.68 mmol/L (ref 0.5–2.2)

## 2014-01-31 MED ORDER — AMLODIPINE BESYLATE 5 MG PO TABS
5.0000 mg | ORAL_TABLET | Freq: Every day | ORAL | Status: DC
  Administered 2014-02-01 – 2014-02-04 (×4): 5 mg via ORAL
  Filled 2014-01-31 (×5): qty 1

## 2014-01-31 MED ORDER — FUROSEMIDE 40 MG PO TABS
40.0000 mg | ORAL_TABLET | Freq: Every day | ORAL | Status: DC
  Administered 2014-02-01 – 2014-02-04 (×4): 40 mg via ORAL
  Filled 2014-01-31: qty 1
  Filled 2014-01-31: qty 2
  Filled 2014-01-31 (×3): qty 1

## 2014-01-31 MED ORDER — CLOPIDOGREL BISULFATE 75 MG PO TABS
75.0000 mg | ORAL_TABLET | Freq: Every day | ORAL | Status: DC
  Administered 2014-02-01 – 2014-02-04 (×4): 75 mg via ORAL
  Filled 2014-01-31 (×5): qty 1

## 2014-01-31 MED ORDER — ALBUTEROL SULFATE (2.5 MG/3ML) 0.083% IN NEBU
5.0000 mg | INHALATION_SOLUTION | Freq: Once | RESPIRATORY_TRACT | Status: AC
  Administered 2014-01-31: 5 mg via RESPIRATORY_TRACT
  Filled 2014-01-31: qty 6

## 2014-01-31 MED ORDER — ALBUTEROL SULFATE HFA 108 (90 BASE) MCG/ACT IN AERS: 2.0000 | INHALATION_SPRAY | RESPIRATORY_TRACT | Status: DC | PRN

## 2014-01-31 MED ORDER — ASPIRIN 325 MG PO TABS
325.0000 mg | ORAL_TABLET | Freq: Every day | ORAL | Status: DC
  Administered 2014-02-01 – 2014-02-04 (×4): 325 mg via ORAL
  Filled 2014-01-31 (×5): qty 1

## 2014-01-31 MED ORDER — CLINDAMYCIN PHOSPHATE 600 MG/50ML IV SOLN
600.0000 mg | Freq: Four times a day (QID) | INTRAVENOUS | Status: DC
  Administered 2014-01-31 – 2014-02-02 (×7): 600 mg via INTRAVENOUS
  Filled 2014-01-31 (×10): qty 50

## 2014-01-31 MED ORDER — PRAVASTATIN SODIUM 20 MG PO TABS
20.0000 mg | ORAL_TABLET | Freq: Every day | ORAL | Status: DC
  Administered 2014-02-01 – 2014-02-03 (×3): 20 mg via ORAL
  Filled 2014-01-31 (×4): qty 1

## 2014-01-31 MED ORDER — ALBUTEROL SULFATE (2.5 MG/3ML) 0.083% IN NEBU: 2.5000 mg | INHALATION_SOLUTION | RESPIRATORY_TRACT | Status: DC | PRN

## 2014-01-31 MED ORDER — PREDNISONE 20 MG PO TABS: 50.0000 mg | ORAL_TABLET | Freq: Every day | ORAL | Status: DC

## 2014-01-31 MED ORDER — HEPARIN SODIUM (PORCINE) 5000 UNIT/ML IJ SOLN
5000.0000 [IU] | Freq: Three times a day (TID) | INTRAMUSCULAR | Status: DC
  Administered 2014-02-01 – 2014-02-04 (×10): 5000 [IU] via SUBCUTANEOUS
  Filled 2014-01-31 (×14): qty 1

## 2014-01-31 NOTE — Progress Notes (Signed)
Report taken from [place], [place] in ED. RN states that they are waiting on IV team for peripheral IV restart.

## 2014-01-31 NOTE — ED Notes (Signed)
Presents with bilateral leg erythema post fall Monday, she has bilateral knee scrapes and redness down from wounds on knees to toes. Warm to touch and painful. Denies fevers.  Endorses weakness and SOb.  Sats 84% ra-

## 2014-01-31 NOTE — ED Notes (Signed)
IV infiltrated. Two nurses attempted IV start.

## 2014-01-31 NOTE — ED Provider Notes (Signed)
CSN: [id number]     Arrival date & time 01/31/14  1608 History   First MD Initiated Contact with Patient 01/31/14 1635     Chief Complaint  Patient presents with  . Cellulitis     (Consider location/radiation/quality/duration/timing/severity/associated sxs/prior Treatment) The history is provided by the patient.   patient presents with swelling and redness in both her legs. Around a week ago she had a fall and was unable to get up. She was on the ground for a night and ended up with scrapes on her knees and her toes. She been doing well and was walking again, but began to do worse. Increasing weakness. Has increased redness. Saw her PCP and was started on minocycline yesterday. She's had 2 doses. No fevers. She denies shortness of breath. She denies cough. History of CHF.  Past Medical History  Diagnosis Date  . Coronary artery disease     No major obstruction in the left system.  100 RCA occlusion treated with a Taxus stent July 2006  . Hypertension   . Renal insufficiency   . CHF (congestive heart failure)   . Stenosis of aortic valve   . Abdominal aortic aneurysm   . Renal artery stenosis   . Glaucoma   . Cataract   . Rhinitis   . Hypoxemia    Past Surgical History  Procedure Laterality Date  . Angioplasty      with stent  . Hysterotomy    . Tubal ligation    . Appendectomy    . Cataract surgery    . Hernia repair    . Tumor removal    . Abdominal aortic aneurysm repair     Family History  Problem Relation Age of Onset  . Cancer Father   . Coronary artery disease Brother [id number]   History  Substance Use Topics  . Smoking status: Former Smoker    Quit date: 06/10/1981  . Smokeless tobacco: Not on file  . Alcohol Use: No   OB History    No data available     Review of Systems  Constitutional: Positive for fatigue. Negative for activity change and appetite change.  Eyes: Negative for pain.  Respiratory: Negative for chest tightness and shortness of breath.    Cardiovascular: Positive for leg swelling. Negative for chest pain.  Gastrointestinal: Negative for nausea, vomiting, abdominal pain and diarrhea.  Genitourinary: Negative for flank pain.  Musculoskeletal: Negative for back pain and neck stiffness.  Skin: Positive for color change and wound. Negative for rash.  Neurological: Negative for weakness, numbness and headaches.  Psychiatric/Behavioral: Negative for behavioral problems.      Allergies  Beta adrenergic blockers  Home Medications   Prior to Admission medications   Medication Sig Start Date End Date Taking? Authorizing Provider  albuterol (PROVENTIL HFA;VENTOLIN HFA) 108 (90 BASE) MCG/ACT inhaler Inhale 2 puffs into the lungs every 4 (four) hours as needed for wheezing or shortness of breath.    Yes Historical Provider, MD  amLODipine (NORVASC) 5 MG tablet Take 5 mg by mouth daily.   Yes Historical Provider, MD  aspirin 325 MG tablet Take 325 mg by mouth daily.   Yes Historical Provider, MD  clopidogrel (PLAVIX) 75 MG tablet Take 1 tablet (75 mg total) by mouth daily. 11/27/13  Yes Rollene RotundaJames Hochrein, MD  furosemide (LASIX) 40 MG tablet Take 40 mg by mouth daily. 01/04/14  Yes Historical Provider, MD  lovastatin (MEVACOR) 20 MG tablet Take 20 mg by mouth at bedtime.  Yes Historical Provider, MD   BP 141/98 mmHg  Pulse 70  Temp(Src) 98 F (36.7 C) (Oral)  Resp 20  Ht  (1.499 m)  Wt 162 lb (73.483 kg)  BMI 32.70 kg/m2  SpO2 94% Physical Exam  Constitutional: She is oriented to person, place, and time. She appears well-developed and well-nourished.  HENT:  Head: Normocephalic.  Neck: No JVD present.  Cardiovascular: Normal rate.   Pulmonary/Chest:  Decreased breath sounds throughout. Scattered wheezes and prolonged expirations minimally.  Abdominal: There is no tenderness.  Musculoskeletal: She exhibits edema.  Neurological: She is alert and oriented to person, place, and time.  Skin:  Abrasions that are healing  to bilateral knees. Moderate size. Abrasions to toes also. Erythema with some pitting edema to bilateral lower legs.    ED Course  Procedures (including critical care time) Labs Review Labs Reviewed  CBC WITH DIFFERENTIAL - Abnormal; Notable for the following:    MCHC 29.7 (*)    All other components within normal limits  COMPREHENSIVE METABOLIC PANEL - Abnormal; Notable for the following:    BUN 29 (*)    Creatinine, Ser 1.49 (*)    Albumin 3.3 (*)    GFR calc non Af Amer 28 (*)    GFR calc Af Amer 33 (*)    Anion gap 16 (*)    All other components within normal limits  BRAIN NATRIURETIC PEPTIDE - Abnormal; Notable for the following:    B Natriuretic Peptide 109.8 (*)    All other components within normal limits  URINE CULTURE  CK  TROPONIN I  URINALYSIS, ROUTINE W REFLEX MICROSCOPIC  CBC  BASIC METABOLIC PANEL  I-STAT CG4 LACTIC ACID, ED    Imaging Review Dg Chest Port 1 View  01/31/2014   CLINICAL DATA:  Shortness of breath.  History of hypertension.  EXAM: PORTABLE CHEST - 1 VIEW  COMPARISON:  Chest radiograph 04/02/2008  FINDINGS: Multiple leads overlie the patient. Stable cardiomegaly. Minimal heterogeneous opacities within the left and right lower lungs. No definite pleural effusion or pneumothorax.  IMPRESSION: Cardiomegaly.  Minimal bilateral lower lung heterogeneous opacities suggestive of atelectasis. Infection not excluded.   Electronically Signed   By: Annia Belt M.D.   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 Pronounced hallux valgus deformity. Small heel spur. Degenerative changes at the tarsometatarsal level dorsally. Diffuse midfoot soft tissue swelling and hindfoot soft tissue swelling. Small  vessel calcification.  IMPRESSION: Chronic changes with no acute findings. Limited evaluation of the bases of the metatarsals in the absence of oblique image.   Electronically Signed   By: Esperanza Heir M.D.   On: 01/31/2014 21:22     EKG Interpretation   Date/Time:  Sunday January 31 2014 17:18:27 EST Ventricular Rate:  68 PR Interval:  160 QRS Duration: 103 QT Interval:  436 QTC Calculation: 464 R Axis:   -22 Text Interpretation:  Sinus rhythm Left ventricular hypertrophy Confirmed  by Rubin Payor  MD, Harrold Donath [phone]) on 02/01/2014 12:38:11 AM      MDM   Final diagnoses:  Cellulitis of lower extremity, unspecified laterality  Hypoxia    Patient with redness and swelling of her lower legs. Treat for  infection. Does have some hypoxia but does not appear to be in CHF. Has chronic lung diseases and is likely chronically hypoxic. She is supposed on oxygen but will not use it. Will admit to internal medicine.    Juliet Rude. Rubin Payor, MD 02/01/14 [id number]

## 2014-01-31 NOTE — H&P (Signed)
[facility] History and Physical  Eileen Cruz WJX:[id number] DOB: 03/15/1915 DOA: 01/31/2014  Referring physician: EDP PCP: Kaleen Mask, MD   Chief Complaint: Leg wounds   HPI: Eileen Cruz is a 81 y.o. female who fell last week, lives at home independently still, she scraped up her legs and knees trying to get back up.  Developed erythema on BLE, saw PCP who put her on doxycycline yesterday, doxycyline has made no improvement to BLE erythema.  Has ben feeling more generally weak since the fall.  SOB is chronic and baseline per patient, was supposed to be on home O2 but weaned her self off of it back in 2010, refuses to go back on it per Dr. Shelle Iron.  Review of Systems: Systems reviewed.  As above, otherwise negative  Past Medical History  Diagnosis Date  . Coronary artery disease     No major obstruction in the left system.  100 RCA occlusion treated with a Taxus stent July 2006  . Hypertension   . Renal insufficiency   . CHF (congestive heart failure)   . Stenosis of aortic valve   . Abdominal aortic aneurysm   . Renal artery stenosis   . Glaucoma   . Cataract   . Rhinitis   . Hypoxemia    Past Surgical History  Procedure Laterality Date  . Angioplasty      with stent  . Hysterotomy    . Tubal ligation    . Appendectomy    . Cataract surgery    . Hernia repair    . Tumor removal    . Abdominal aortic aneurysm repair     Social History:  reports that she quit smoking about 32 years ago. She does not have any smokeless tobacco history on file. She reports that she does not drink alcohol or use illicit drugs.  Allergies  Allergen Reactions  . Beta Adrenergic Blockers Other (See Comments)    REACTION: bradycardia    Family History  Problem Relation Age of Onset  . Cancer Father   . Coronary artery disease Brother 39     Prior to Admission medications   Medication Sig Start Date End Date Taking? Authorizing Provider  albuterol (PROVENTIL  HFA;VENTOLIN HFA) 108 (90 BASE) MCG/ACT inhaler Inhale 2 puffs into the lungs every 4 (four) hours as needed for wheezing or shortness of breath.    Yes Historical Provider, MD  amLODipine (NORVASC) 5 MG tablet Take 5 mg by mouth daily.   Yes Historical Provider, MD  aspirin 325 MG tablet Take 325 mg by mouth daily.   Yes Historical Provider, MD  clopidogrel (PLAVIX) 75 MG tablet Take 1 tablet (75 mg total) by mouth daily. 11/27/13  Yes Rollene Rotunda, MD  furosemide (LASIX) 40 MG tablet Take 40 mg by mouth daily. 01/04/14  Yes Historical Provider, MD  lovastatin (MEVACOR) 20 MG tablet Take 20 mg by mouth at bedtime.   Yes Historical Provider, MD   Physical Exam: Filed Vitals:   01/31/14 2000  BP: 165/58  Pulse: 72  Temp:   Resp: 18    BP 165/58 mmHg  Pulse 72  Temp(Src) 98.1 F (36.7 C) (Oral)  Resp 18  Ht  (1.499 m)  Wt 73.483 kg (162 lb)  BMI 32.70 kg/m2  SpO2 95%  General Appearance:    Alert, oriented, no distress, appears stated age  Head:    Normocephalic, atraumatic  Eyes:    PERRL, EOMI, sclera non-icteric  Nose:   Nares without drainage or epistaxis. Mucosa, turbinates normal  Throat:   Moist mucous membranes. Oropharynx without erythema or exudate.  Neck:   Supple. No carotid bruits.  No thyromegaly.  No lymphadenopathy.   Back:     No CVA tenderness, no spinal tenderness  Lungs:     Diffuse wheezing  Chest wall:    No tenderness to palpitation  Heart:    Regular rate and rhythm without murmurs, gallops, rubs  Abdomen:     Soft, non-tender, nondistended, normal bowel sounds, no organomegaly  Genitalia:    deferred  Rectal:    deferred  Extremities:   No clubbing, cyanosis or edema.  Pulses:   2+ and symmetric all extremities  Skin:   Scrapes and cellulitis on BLE, deformity of RLE great toe (chronic) bruising about it (new).  Lymph nodes:   Cervical, supraclavicular, and axillary nodes normal  Neurologic:   CNII-XII intact. Normal strength, sensation  and reflexes      throughout    Labs on Admission:  Basic Metabolic Panel:  Recent Labs Lab 01/31/14 1644  NA 143  K 4.4  CL 96  CO2 31  GLUCOSE 93  BUN 29*  CREATININE 1.49*  CALCIUM 9.4   Liver Function Tests:  Recent Labs Lab 01/31/14 1644  AST 26  ALT 17  ALKPHOS 61  BILITOT 0.5  PROT 7.0  ALBUMIN 3.3*   No results for input(s): LIPASE, AMYLASE in the last 168 hours. No results for input(s): AMMONIA in the last 168 hours. CBC:  Recent Labs Lab 01/31/14 1644  WBC 6.5  NEUTROABS 3.7  HGB 12.8  HCT 43.1  MCV 90.0  PLT 213   Cardiac Enzymes:  Recent Labs Lab 01/31/14 1644  CKTOTAL 104  TROPONINI <0.03    BNP (last 3 results) No results for input(s): PROBNP in the last 8760 hours. CBG: No results for input(s): GLUCAP in the last 168 hours.  Radiological Exams on Admission: Dg Chest Port 1 View  01/31/2014   CLINICAL DATA:  Shortness of breath.  History of hypertension.  EXAM: PORTABLE CHEST - 1 VIEW  COMPARISON:  Chest radiograph 04/02/2008  FINDINGS: Multiple leads overlie the patient. Stable cardiomegaly. Minimal heterogeneous opacities within the left and right lower lungs. No definite pleural effusion or pneumothorax.  IMPRESSION: Cardiomegaly.  Minimal bilateral lower lung heterogeneous opacities suggestive of atelectasis. Infection not excluded.   Electronically Signed   By: Annia Beltrew  Davis M.D.   On: 01/31/2014 17:29    EKG: Independently reviewed.  Assessment/Plan Principal Problem:   Cellulitis Active Problems:   Hypoxemia   COPD (chronic obstructive pulmonary disease)   1. Cellulitis of BLE - 1. Clindamycin 2. Wound care 3. Due to ecchymosis, will get X ray of R foot, patient has neuropathy and limited sensation at baseline. 2. Chronic hypoxic respiratory failure due to COPD - 1. Adult wheeze protocol 2. Not in acute exacerbation so will hold off on steroids 3. O2 per protocol. 4. Continuous pulse ox and tele monitor    Code  Status: Full Code  Family Communication: Son at bedside Disposition Plan: Admit to inpatient   Time spent: 50 min  GARDNER, JARED M. [facility] Pager [phone]  If 7AM-7PM, please contact the day team taking care of the patient [email or web address] Password [email or web address] 01/31/2014, 8:11 PM

## 2014-01-31 NOTE — ED Notes (Signed)
Patient states takes the medications ordered in the AM.

## 2014-01-31 NOTE — ED Notes (Signed)
IV team at bedside 

## 2014-01-31 NOTE — ED Notes (Signed)
Admit Doctor stated will not order blood cultures.

## 2014-01-31 NOTE — ED Notes (Signed)
[facility] to [facility].

## 2014-01-31 NOTE — ED Notes (Signed)
Admit Doctor at bedside.  

## 2014-02-01 ENCOUNTER — Encounter (HOSPITAL_COMMUNITY): Payer: Self-pay | Admitting: *Deleted

## 2014-02-01 LAB — URINE CULTURE

## 2014-02-01 LAB — BASIC METABOLIC PANEL
Anion gap: 7 (ref 5–15)
BUN: 29 mg/dL — AB (ref 6–23)
CALCIUM: 8.5 mg/dL (ref 8.4–10.5)
CO2: 35 mmol/L — ABNORMAL HIGH (ref 19–32)
CREATININE: 1.33 mg/dL — AB (ref 0.50–1.10)
Chloride: 97 mEq/L (ref 96–112)
GFR calc Af Amer: 38 mL/min — ABNORMAL LOW (ref >90)
GFR calc non Af Amer: 33 mL/min — ABNORMAL LOW (ref >90)
Glucose, Bld: 101 mg/dL — ABNORMAL HIGH (ref 70–99)
Potassium: 3.9 mmol/L (ref 3.5–5.1)
Sodium: 139 mmol/L (ref 135–145)

## 2014-02-01 LAB — CBC
HCT: 37.2 % (ref 36.0–46.0)
Hemoglobin: 10.8 g/dL — ABNORMAL LOW (ref 12.0–15.0)
MCH: 26.2 pg (ref 26.0–34.0)
MCHC: 29 g/dL — ABNORMAL LOW (ref 30.0–36.0)
MCV: 90.3 fL (ref 78.0–100.0)
Platelets: 170 10*3/uL (ref 150–400)
RBC: 4.12 MIL/uL (ref 3.87–5.11)
RDW: 15.5 % (ref 11.5–15.5)
WBC: 4.6 10*3/uL (ref 4.0–10.5)

## 2014-02-01 MED ORDER — SILVER SULFADIAZINE 1 % EX CREA
TOPICAL_CREAM | Freq: Every day | CUTANEOUS | Status: DC
  Administered 2014-02-01 – 2014-02-04 (×4): via TOPICAL
  Filled 2014-02-01: qty 85

## 2014-02-01 MED ORDER — CETYLPYRIDINIUM CHLORIDE 0.05 % MT LIQD
7.0000 mL | Freq: Two times a day (BID) | OROMUCOSAL | Status: DC
  Administered 2014-02-01 – 2014-02-04 (×6): 7 mL via OROMUCOSAL

## 2014-02-01 NOTE — Evaluation (Signed)
Occupational Therapy Evaluation Patient Details Name: Eileen Cruz MRN: [id number] DOB: [birth year] Today's Date: 02/01/2014    History of Present Illness 81 y.o. s/p fall at home. Has been feeling weak since the fall. Pt has low vision.   Clinical Impression   Pt admitted with above. Feel pt will benefit from acute OT to increase independence with BADLs and increase strength prior to d/c. Recommending HHOT upon d/c.     Follow Up Recommendations  Home health OT;Supervision/Assistance - 24 hour    Equipment Recommendations  None recommended by OT    Recommendations for Other Services       Precautions / Restrictions Precautions Precautions: Fall Restrictions Weight Bearing Restrictions: No      Mobility Bed Mobility Overal bed mobility: Needs Assistance Bed Mobility: Supine to Sit     Supine to sit: Supervision        Transfers Overall transfer level: Needs assistance Equipment used: Rolling walker (2 wheeled) Transfers: Sit to/from Stand Sit to Stand: Min assist         General transfer comment: cues for hand placement. Min A to boost from bed.    Balance                                            ADL Overall ADL's : Needs assistance/impaired Eating/Feeding: Set up;Sitting   Grooming: Wash/dry face;Oral care;Applying deodorant;Minimal assistance;Standing   Upper Body Bathing: Set up;Supervision/ safety;Standing   Lower Body Bathing: Min guard;Sit to/from stand   Upper Body Dressing : Sitting;Minimal assistance   Lower Body Dressing: Maximal assistance;Sit to/from stand   Toilet Transfer: Min guard;RW;Ambulation (bed)           Functional mobility during ADLs: Min guard;Rolling walker General ADL Comments: educated on safety in session (example: sitting for LB ADLs) Cues for deep breathing technique in session as O2 dropping. Pt performed ADLs at sink. Reviewed UB dressing technique due to limited ROM in right shoulder.  Educated on AE for LB ADLs.     Vision  Pt with vision deficits. Reports she had surgery for glaucoma, but made her vision worse.                   Perception     Praxis      Pertinent Vitals/Pain Pain Assessment: No/denies pain;Took pt off of 2L of O2 and oxygen dropping to 80's. Placed back on 2L and O2 still dropped in session to 80's, bumped up to 3L and then 4L due to O2 dropping to 70's-80's. Notified nurse. Left pt on 4L of O2 at end of session. *pulse oximeter having difficult time reading     Hand Dominance     Extremity/Trunk Assessment Upper Extremity Assessment Upper Extremity Assessment: RUE deficits/detail;Generalized weakness RUE Deficits / Details: less than 90 degrees AROM right shoulder due to arthritis   Lower Extremity Assessment Lower Extremity Assessment: Defer to PT evaluation       Communication Communication Communication: HOH   Cognition Arousal/Alertness: Awake/alert Behavior During Therapy: WFL for tasks assessed/performed Overall Cognitive Status: Within Functional Limits for tasks assessed                     General Comments       Exercises       Shoulder Instructions      Home Living Family/patient expects to be discharged  to:: Private residence Living Arrangements: Alone Available Help at Discharge: Family;Other (Comment) (son trying to work out assist at home) Type of Home: Mobile home Home Access: Stairs to enter Secretary/administratorntrance Stairs-Number of Steps: 3 Entrance Stairs-Rails: Right;Left;Can reach both Home Layout: One level     Bathroom Shower/Tub: Chief Strategy OfficerTub/shower unit   Bathroom Toilet: Standard     Home Equipment: Environmental consultantWalker - 4 wheels;Toilet riser;Shower seat;Cane - single point          Prior Functioning/Environment Level of Independence: Needs assistance    ADL's / Homemaking Assistance Needed: son has been assisting since fall        OT Diagnosis: Generalized weakness   OT Problem List: Decreased  strength;Decreased range of motion;Decreased activity tolerance;Decreased knowledge of use of DME or AE;Decreased knowledge of precautions;Impaired UE functional use;Impaired vision/perception   OT Treatment/Interventions: Self-care/ADL training;DME and/or AE instruction;Therapeutic exercise;Therapeutic activities;Visual/perceptual remediation/compensation;Patient/family education;Balance training    OT Goals(Current goals can be found in the care plan section) Acute Rehab OT Goals Patient Stated Goal: not stated OT Goal Formulation: With patient Time For Goal Achievement: 02/08/14 Potential to Achieve Goals: Good ADL Goals Pt Will Perform Grooming: with set-up;standing Pt Will Perform Lower Body Bathing: sit to/from stand;with min assist Pt Will Perform Lower Body Dressing: with min assist;sit to/from stand Pt Will Transfer to Toilet: ambulating;with modified independence Pt Will Perform Toileting - Clothing Manipulation and hygiene: sit to/from stand;with modified independence  OT Frequency: Min 2X/week   Barriers to D/C:            Co-evaluation              End of Session Equipment Utilized During Treatment: Gait belt;Rolling walker;Oxygen Nurse Communication: Other (comment) (O2 sats; low vision)  Activity Tolerance: Patient tolerated treatment well Patient left: in bed;with call bell/phone within reach;with bed alarm set   Time: [id number] OT Time Calculation (min): 31 min Charges:  OT General Charges $OT Visit: 1 Procedure OT Evaluation $Initial OT Evaluation Tier I: 1 Procedure OT Treatments $Self Care/Home Management : 8-22 mins G-CodesEarlie Raveling:    Javonni Macke L OTR/L [phone] 02/01/2014, 10:50 AM

## 2014-02-01 NOTE — Progress Notes (Signed)
Utilization review completed. Gianah Batt, RN, BSN. 

## 2014-02-01 NOTE — Progress Notes (Signed)
Patient admitted to unit via stretcher to [phone]. Patient alert and oriented x 4 and pleasant. IV intact. Skin present with abrasions: right knee  7cm x 3cm, right great toe 2cm x 1cm, right 2nd toe 1.5 cm x 0.5 cm, left knee 7cm x 4cm , left great toe 1cmx1cm . Call bell within reach, patient understands how to get into contact with nursing staff. Patient declined safety video at this time, patient states "I have trouble with my vision, i wouldn't be able to see it." Fall safety reviewed with patient. Will continue to monitor patient per MD orders.

## 2014-02-01 NOTE — Consult Note (Signed)
WOC wound consult note Reason for Consult: Traumatic wounds to bilateral knees from fall with subsequent cellulitis Wound type: Trauma  Measurement: Left knee:  4 cm x 6 cm x 0.1 cm 90% scabbed wound bed Right knee 4 cm x 7 cm x 0.1 cm 75% scabbed wound bed Both knee wounds with erythema present circumferentially, extending 0.5 cm, tender to touch.  Wound bed: Mostly scabbed, devitalized tissue.  Drainage (amount, consistency, odor) Minimal, serosanguinous drainage.  Periwound: Erythema Dressing procedure/placement/frequency:Cleanse bilateral knees with NS and pat gently dry.  Apply Silvadene cream to wound bed.  Cover with nonadherent telfa pad and 4x4 gauze.  Secure with kerlix and tape.  Change daily.  Conservative sharp wound debridement (CSWD performed at the bedside): n/a Will not follow at this time.  Please re-consult if needed.  Maple HudsonKaren Halden Phegley RN BSN CWON Pager [phone]

## 2014-02-01 NOTE — Progress Notes (Signed)
Subjective: No specific concerns.  Reports feeling better today.  Objective: Vital signs in last 24 hours: Filed Vitals:   01/31/14 2152 01/31/14 2227 02/01/14 0627 02/01/14 1325  BP: 156/48 141/98 132/48 156/51  Pulse:   61   Temp:  98 F (36.7 C) 98.3 F (36.8 C) 98 F (36.7 C)  TempSrc:  Oral Oral Oral  Resp: 20 20 21 20   Height:      Weight:      SpO2: 98% 94% 95% 100%   Weight change:   Intake/Output Summary (Last 24 hours) at 02/01/14 1430 Last data filed at 02/01/14 1000  Gross per 24 hour  Intake    752 ml  Output    525 ml  Net    227 ml    Physical Exam: General: Awake, Oriented, No acute distress. HEENT: EOMI. Neck: Supple CV: S1 and S2 Lungs: Clear to ascultation bilaterally Abdomen: Soft, Nontender, Nondistended, +bowel sounds. Ext: Good pulses. Trace edema.  Erythema in bilateral shin.  Wounds on knees bilaterally.  Lab Results: Basic Metabolic Panel:  Recent Labs Lab 01/31/14 1644 02/01/14 0505  NA 143 139  K 4.4 3.9  CL 96 97  CO2 31 35*  GLUCOSE 93 101*  BUN 29* 29*  CREATININE 1.49* 1.33*  CALCIUM 9.4 8.5   Liver Function Tests:  Recent Labs Lab 01/31/14 1644  AST 26  ALT 17  ALKPHOS 61  BILITOT 0.5  PROT 7.0  ALBUMIN 3.3*   No results for input(s): LIPASE, AMYLASE in the last 168 hours. No results for input(s): AMMONIA in the last 168 hours. CBC:  Recent Labs Lab 01/31/14 1644 02/01/14 0505  WBC 6.5 4.6  NEUTROABS 3.7  --   HGB 12.8 10.8*  HCT 43.1 37.2  MCV 90.0 90.3  PLT 213 170   Cardiac Enzymes:  Recent Labs Lab 01/31/14 1644  CKTOTAL 104  TROPONINI <0.03   BNP (last 3 results) No results for input(s): PROBNP in the last 8760 hours. CBG: No results for input(s): GLUCAP in the last 168 hours. No results for input(s): HGBA1C in the last 72 hours. Other Labs: Invalid input(s): POCBNP No results for input(s): DDIMER in the last 168 hours. No results for input(s): CHOL, HDL, LDLCALC, TRIG, CHOLHDL,  LDLDIRECT in the last 168 hours. No results for input(s): TSH, T4TOTAL, T3FREE, FREET4, THYROIDAB in the last 168 hours.  Invalid input(s): FREET3 No results for input(s): VITAMINB12, FOLATE, FERRITIN, TIBC, IRON, RETICCTPCT in the last 168 hours.  Micro Results: No results found for this or any previous visit (from the past 240 hour(s)).  Studies/Results: Dg Chest Port 1 View  01/31/2014   CLINICAL DATA:  Shortness of breath.  History of hypertension.  EXAM: PORTABLE CHEST - 1 VIEW  COMPARISON:  Chest radiograph 04/02/2008  FINDINGS: Multiple leads overlie the patient. Stable cardiomegaly. Minimal heterogeneous opacities within the left and right lower lungs. No definite pleural effusion or pneumothorax.  IMPRESSION: Cardiomegaly.  Minimal bilateral lower lung heterogeneous opacities suggestive of atelectasis. Infection not excluded.   Electronically Signed   By: Annia Beltrew  Davis M.D.   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 Pronounced hallux valgus deformity. Small heel spur. Degenerative changes  at the tarsometatarsal level dorsally. Diffuse midfoot soft tissue swelling and hindfoot soft tissue swelling. Small vessel calcification.  IMPRESSION: Chronic changes with no acute findings. Limited evaluation of the bases of the metatarsals in the absence of oblique image.   Electronically Signed   By: Esperanza Heir M.D.   On: 01/31/2014 21:22    Medications: I have reviewed the patient's current medications. Scheduled Meds: . amLODipine  5 mg Oral Daily  . antiseptic oral rinse  7 mL Mouth Rinse BID  . aspirin  325 mg Oral Daily  . clindamycin (CLEOCIN) IV  600 mg Intravenous 4 times per day  . clopidogrel  75 mg Oral Daily  .  furosemide  40 mg Oral Daily  . heparin  5,000 Units Subcutaneous 3 times per day  . pravastatin  20 mg Oral q1800  . silver sulfADIAZINE   Topical Daily   Continuous Infusions:  PRN Meds:.albuterol  Assessment/Plan: Cellulitis of BLE -Continue Clindamycin, was previously on doxycycline.  Continue wound care, appreciate their input. Wound care recommending "Cleanse bilateral knees with NS and pat gently dry. Apply Silvadene cream to wound bed. Cover with nonadherent telfa pad and 4x4 gauze. Secure with kerlix and tape. Change daily." -X-ray of right foot negative.  Chronic hypoxic respiratory failure due to COPD -Not in acute exacerbation so will hold off on steroids -O2 per protocol. -Continuous pulse ox.  Coronary artery disease Stable.  Hypertension Stable.  Chronic kidney disease stage III/IV Renal function at baseline.  Continue to monitor.  Generalized weakness Physical therapy recommending home health physical therapy.  Prophylaxis Subcutaneous heparin.  CODE STATUS Full code.  Disposition Continue as inpatient.   LOS: 1 day  Quisha Mabie A, MD 02/01/2014, 2:30 PM

## 2014-02-01 NOTE — Evaluation (Signed)
Physical Therapy Evaluation Patient Details Name: Eileen Cruz MRN: [id number] DOB: 03/27/1915 Today's Date: 02/01/2014   History of Present Illness  81 y.o. s/p fall at home. Has been feeling weak since the fall.  Clinical Impression  Pt admitted with/for fall and general weakness with some hypoxia.  Pt currently limited functionally due to the problems listed below.  (see problems list.)  Pt will benefit from PT to maximize function and safety to be able to get home safely with available assist of family or PCA.     Follow Up Recommendations Home health PT;Supervision/Assistance - 24 hour    Equipment Recommendations       Recommendations for Other Services       Precautions / Restrictions Precautions Precautions: Fall Restrictions Weight Bearing Restrictions: No      Mobility  Bed Mobility Overal bed mobility: Needs Assistance Bed Mobility: Supine to Sit     Supine to sit: Supervision;HOB elevated        Transfers Overall transfer level: Needs assistance Equipment used: Rolling walker (2 wheeled) Transfers: Sit to/from Stand Sit to Stand: Min assist         General transfer comment: cues for hand placement. Min A to boost from bed.  Ambulation/Gait Ambulation/Gait assistance: Min guard Ambulation Distance (Feet): 15 Feet (to bathroom then additional 140 feet with RW) Assistive device: Rolling walker (2 wheeled) Gait Pattern/deviations: Step-through pattern Gait velocity: slow   General Gait Details: short, slow steps; stays too far from RW and steps out of walker on turns  Careers information officertairs            Wheelchair Mobility    Modified Rankin (Stroke Patients Only)       Balance Overall balance assessment: Needs assistance Sitting-balance support: Feet supported;No upper extremity supported Sitting balance-Leahy Scale: Fair     Standing balance support: Bilateral upper extremity supported;Single extremity supported Standing balance-Leahy Scale:  Poor                               Pertinent Vitals/Pain Pain Assessment: No/denies pain    Home Living Family/patient expects to be discharged to:: Private residence Living Arrangements: Alone Available Help at Discharge: Family;Other (Comment) (son trying to work out assist at home) Type of Home: Mobile home Home Access: Stairs to enter Entrance Stairs-Rails: Right;Left;Can reach both Entrance Stairs-Number of Steps: 3 Home Layout: One level Home Equipment: Walker - 4 wheels;Toilet riser;Shower seat;Cane - single point      Prior Function Level of Independence: Needs assistance      ADL's / Homemaking Assistance Needed: son has been assisting since fall        Hand Dominance        Extremity/Trunk Assessment   Upper Extremity Assessment: Defer to OT evaluation RUE Deficits / Details: less than 90 degrees AROM right shoulder due to arthritis         Lower Extremity Assessment: Generalized weakness;Overall WFL for tasks assessed (weak df/pf bil)         Communication   Communication: HOH  Cognition Arousal/Alertness: Awake/alert Behavior During Therapy: WFL for tasks assessed/performed Overall Cognitive Status: Within Functional Limits for tasks assessed                      General Comments General comments (skin integrity, edema, etc.): Pt's sats on 4L [place] with any exertion dropped to between upper 70's to 82 % with 2-3 min rerturn  to 90% at rest.  EHR in the mid 80'd to upper 90's bpm.    Exercises        Assessment/Plan    PT Assessment Patient needs continued PT services  PT Diagnosis Difficulty walking;Generalized weakness;Other (comment) (decreased activity tolerance)   PT Problem List Decreased strength;Decreased activity tolerance;Decreased balance;Decreased mobility;Decreased knowledge of use of DME;Cardiopulmonary status limiting activity  PT Treatment Interventions DME instruction;Gait training;Functional mobility  training;Stair training;Therapeutic activities;Therapeutic exercise;Balance training;Patient/family education   PT Goals (Current goals can be found in the Care Plan section) Acute Rehab PT Goals Patient Stated Goal: want to go home PT Goal Formulation: With patient Time For Goal Achievement: 02/08/14 Potential to Achieve Goals: Good    Frequency Min 3X/week   Barriers to discharge Decreased caregiver support      Co-evaluation               End of Session Equipment Utilized During Treatment: Oxygen Activity Tolerance: Patient limited by fatigue;Patient tolerated treatment well Patient left: in bed;with call bell/phone within reach;with bed alarm set Nurse Communication: Mobility status         Time: 9604-5409 PT Time Calculation (min) (ACUTE ONLY): 32 min   Charges:   PT Evaluation $Initial PT Evaluation Tier I: 1 Procedure PT Treatments $Gait Training: 8-22 mins $Therapeutic Activity: 8-22 mins   PT G Codes:        Eileen Cruz, [name] 02/01/2014, 12:53 PM 02/01/2014  [place] Bing, PT [phone]  (pager)

## 2014-02-02 LAB — BASIC METABOLIC PANEL
Anion gap: 12 (ref 5–15)
BUN: 35 mg/dL — ABNORMAL HIGH (ref 6–23)
CO2: 34 mmol/L — AB (ref 19–32)
CREATININE: 1.31 mg/dL — AB (ref 0.50–1.10)
Calcium: 8.7 mg/dL (ref 8.4–10.5)
Chloride: 93 mEq/L — ABNORMAL LOW (ref 96–112)
GFR calc Af Amer: 38 mL/min — ABNORMAL LOW (ref >90)
GFR calc non Af Amer: 33 mL/min — ABNORMAL LOW (ref >90)
Glucose, Bld: 110 mg/dL — ABNORMAL HIGH (ref 70–99)
Potassium: 4.1 mmol/L (ref 3.5–5.1)
Sodium: 139 mmol/L (ref 135–145)

## 2014-02-02 LAB — CBC
HEMATOCRIT: 38.3 % (ref 36.0–46.0)
Hemoglobin: 11.4 g/dL — ABNORMAL LOW (ref 12.0–15.0)
MCH: 27.4 pg (ref 26.0–34.0)
MCHC: 29.8 g/dL — AB (ref 30.0–36.0)
MCV: 92.1 fL (ref 78.0–100.0)
Platelets: 175 10*3/uL (ref 150–400)
RBC: 4.16 MIL/uL (ref 3.87–5.11)
RDW: 15.5 % (ref 11.5–15.5)
WBC: 5 10*3/uL (ref 4.0–10.5)

## 2014-02-02 MED ORDER — VANCOMYCIN HCL IN DEXTROSE 1-5 GM/200ML-% IV SOLN
1000.0000 mg | Freq: Once | INTRAVENOUS | Status: AC
  Administered 2014-02-02: 1000 mg via INTRAVENOUS
  Filled 2014-02-02: qty 200

## 2014-02-02 MED ORDER — VANCOMYCIN HCL 500 MG IV SOLR
500.0000 mg | INTRAVENOUS | Status: DC
  Administered 2014-02-03 – 2014-02-04 (×2): 500 mg via INTRAVENOUS
  Filled 2014-02-02 (×3): qty 500

## 2014-02-02 NOTE — Progress Notes (Signed)
ANTIBIOTIC CONSULT NOTE - INITIAL  Pharmacy Consult for Vancomycin Indication: cellulitis  Allergies  Allergen Reactions  . Beta Adrenergic Blockers Other (See Comments)    REACTION: bradycardia    Patient Measurements: Height: 4\' 11"  (149.9 cm) Weight: 162 lb (73.483 kg) IBW/kg (Calculated) : 43.2  Vital Signs: Temp: 98.1 F (36.7 C) (01/12 0438) Temp Source: Oral (01/12 0438) BP: 118/40 mmHg (01/12 0438) Pulse Rate: 60 (01/12 0438) Intake/Output from previous day: 01/11 0701 - 01/12 0700 In: 530 [P.O.:480; IV Piggyback:50] Out: 850 [Urine:850] Intake/Output from this shift: Total I/O In: 240 [P.O.:240] Out: -   Labs:  Recent Labs  01/31/14 1644 02/01/14 0505 02/02/14 0026  WBC 6.5 4.6 5.0  HGB 12.8 10.8* 11.4*  PLT 213 170 175  CREATININE 1.49* 1.33* 1.31*   Estimated Creatinine Clearance: 21.9 mL/min (by C-G formula based on Cr of 1.31).    Microbiology: Recent Results (from the past 720 hour(s))  Urine culture     Status: None   Collection Time: 01/31/14  5:34 PM  Result Value Ref Range Status   Specimen Description URINE, CLEAN CATCH  Final   Special Requests NONE  Final   Colony Count   Final    1,000 COLONIES/ML Performed at [facility]    Culture   Final    INSIGNIFICANT GROWTH Performed at [facility]    Report Status 02/01/2014 FINAL  Final    Medical History: Past Medical History  Diagnosis Date  . Coronary artery disease     No major obstruction in the left system.  100 RCA occlusion treated with a Taxus stent July 2006  . Hypertension   . Renal insufficiency   . CHF (congestive heart failure)   . Stenosis of aortic valve   . Abdominal aortic aneurysm   . Renal artery stenosis   . Glaucoma   . Cataract   . Rhinitis   . Hypoxemia    Assessment:   No improvement noted with bilateral lower extremity cellulitis after Clindamycin 600 mg IV q6hrs x 2 days.  To change to Vancomycin.  Renal function at  baseline.  Goal of Therapy:  Vancomycin trough level 10-15 mcg/ml  Plan:   Vancomycin 1 gram IV x 1 as ordered.  Begin vancomcyin 500 mg IV q24hrs on 02/03/13.  Will follow renal function and progress, and will consider checking steady-state vancomycin trough level.  Dennie FettersEgan, Quinesha Selinger Donovan, RPh Pager: [phone] 02/02/2014,10:58 AM

## 2014-02-02 NOTE — Progress Notes (Signed)
Subjective: Feels slightly better. Leg redness about the same as yesterday..  Objective: Vital signs in last 24 hours: Filed Vitals:   02/01/14 0627 02/01/14 1325 02/01/14 2352 02/02/14 0438  BP: 132/48 156/51 131/51 118/40  Pulse: 61  61 60  Temp: 98.3 F (36.8 C) 98 F (36.7 C) 97.8 F (36.6 C) 98.1 F (36.7 C)  TempSrc: Oral Oral Oral Oral  Resp: Height:      Weight:      SpO2: 95% 100% 97% 93%   Weight change:   Intake/Output Summary (Last 24 hours) at 02/02/14 1047 Last data filed at 02/02/14 1028  Gross per 24 hour  Intake    530 ml  Output    850 ml  Net   -320 ml    Physical Exam: General: Awake, Oriented, No acute distress. HEENT: EOMI. Neck: Supple CV: S1 and S2 Lungs: Clear to ascultation bilaterally Abdomen: Soft, Nontender, Nondistended, +bowel sounds. Ext: Good pulses. Trace edema.  Erythema in bilateral shin.  Wounds on knees bilaterally.  Lab Results: Basic Metabolic Panel:  Recent Labs Lab 01/31/14 1644 02/01/14 0505 02/02/14 0026  NA 143 139 139  K 4.4 3.9 4.1  CL 96 97 93*  CO2 31 35* 34*  GLUCOSE 93 101* 110*  BUN 29* 29* 35*  CREATININE 1.49* 1.33* 1.31*  CALCIUM 9.4 8.5 8.7   Liver Function Tests:  Recent Labs Lab 01/31/14 1644  AST 26  ALT 17  ALKPHOS 61  BILITOT 0.5  PROT 7.0  ALBUMIN 3.3*   No results for input(s): LIPASE, AMYLASE in the last 168 hours. No results for input(s): AMMONIA in the last 168 hours. CBC:  Recent Labs Lab 01/31/14 1644 02/01/14 0505 02/02/14 0026  WBC 6.5 4.6 5.0  NEUTROABS 3.7  --   --   HGB 12.8 10.8* 11.4*  HCT 43.1 37.2 38.3  MCV 90.0 90.3 92.1  PLT 213 170 175   Cardiac Enzymes:  Recent Labs Lab 01/31/14 1644  CKTOTAL 104  TROPONINI <0.03   BNP (last 3 results) No results for input(s): PROBNP in the last 8760 hours. CBG: No results for input(s): GLUCAP in the last 168 hours. No results for input(s): HGBA1C in the last 72 hours. Other Labs: Invalid  input(s): POCBNP No results for input(s): DDIMER in the last 168 hours. No results for input(s): CHOL, HDL, LDLCALC, TRIG, CHOLHDL, LDLDIRECT in the last 168 hours. No results for input(s): TSH, T4TOTAL, T3FREE, FREET4, THYROIDAB in the last 168 hours.  Invalid input(s): FREET3 No results for input(s): VITAMINB12, FOLATE, FERRITIN, TIBC, IRON, RETICCTPCT in the last 168 hours.  Micro Results: Recent Results (from the past 240 hour(s))  Urine culture     Status: None   Collection Time: 01/31/14  5:34 PM  Result Value Ref Range Status   Specimen Description URINE, CLEAN CATCH  Final   Special Requests NONE  Final   Colony Count   Final    1,000 COLONIES/ML Performed at [facility]    Culture   Final    INSIGNIFICANT GROWTH Performed at [facility]    Report Status 02/01/2014 FINAL  Final    Studies/Results: Dg Chest Port 1 View  01/31/2014   CLINICAL DATA:  Shortness of breath.  History of hypertension.  EXAM: PORTABLE CHEST - 1 VIEW  COMPARISON:  Chest radiograph 04/02/2008  FINDINGS: Multiple leads overlie the patient. Stable cardiomegaly. Minimal heterogeneous opacities within the left and right lower lungs. No definite  pleural effusion or pneumothorax.  IMPRESSION: Cardiomegaly.  Minimal bilateral lower lung heterogeneous opacities suggestive of atelectasis. Infection not excluded.   Electronically Signed   By: Annia Beltrew  Davis M.D.   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 Pronounced hallux valgus deformity. Small heel spur. Degenerative changes at the tarsometatarsal level dorsally. Diffuse midfoot soft tissue swelling and hindfoot soft tissue swelling. Small vessel calcification.  IMPRESSION:  Chronic changes with no acute findings. Limited evaluation of the bases of the metatarsals in the absence of oblique image.   Electronically Signed   By: Esperanza Heiraymond  Rubner M.D.   On: 01/31/2014 21:22    Medications: I have reviewed the patient's current medications. Scheduled Meds: . amLODipine  5 mg Oral Daily  . antiseptic oral rinse  7 mL Mouth Rinse BID  . aspirin  325 mg Oral Daily  . clopidogrel  75 mg Oral Daily  . furosemide  40 mg Oral Daily  . heparin  5,000 Units Subcutaneous 3 times per day  . pravastatin  20 mg Oral q1800  . silver sulfADIAZINE   Topical Daily  . vancomycin  1,000 mg Intravenous Once   Continuous Infusions:  PRN Meds:.albuterol  Assessment/Plan: Cellulitis of BLE -Discontinue clindamycin and start the patient on vancomycin as her wound are about the same as yesterday.  Continue wound care, appreciate their input. Wound care recommending "Cleanse bilateral knees with NS and pat gently dry. Apply Silvadene cream to wound bed. Cover with nonadherent telfa pad and 4x4 gauze. Secure with kerlix and tape. Change daily." -X-ray of right foot negative.  Chronic hypoxic respiratory failure due to COPD -Stable -Not in acute exacerbation.  Coronary artery disease Stable.  Hypertension Stable.  Chronic kidney disease stage III/IV Renal function at baseline.  Continue to monitor.  Generalized weakness Physical therapy recommending home health physical therapy.  Prophylaxis Subcutaneous heparin.  CODE STATUS Full code.  Disposition Continue as inpatient.  Antibiotics: Clindamycin 1/10 >> 1/12 Vancomycin 1/12 >>   LOS: 2 days  Renaud Celli A, MD 02/02/2014, 10:47 AM

## 2014-02-03 DIAGNOSIS — L03119 Cellulitis of unspecified part of limb: Secondary | ICD-10-CM

## 2014-02-03 DIAGNOSIS — R0600 Dyspnea, unspecified: Secondary | ICD-10-CM

## 2014-02-03 LAB — BASIC METABOLIC PANEL
Anion gap: 9 (ref 5–15)
BUN: 31 mg/dL — ABNORMAL HIGH (ref 6–23)
CHLORIDE: 93 meq/L — AB (ref 96–112)
CO2: 35 mmol/L — ABNORMAL HIGH (ref 19–32)
Calcium: 8.7 mg/dL (ref 8.4–10.5)
Creatinine, Ser: 1.48 mg/dL — ABNORMAL HIGH (ref 0.50–1.10)
GFR, EST AFRICAN AMERICAN: 33 mL/min — AB (ref >90)
GFR, EST NON AFRICAN AMERICAN: 29 mL/min — AB (ref >90)
Glucose, Bld: 112 mg/dL — ABNORMAL HIGH (ref 70–99)
POTASSIUM: 4.6 mmol/L (ref 3.5–5.1)
SODIUM: 137 mmol/L (ref 135–145)

## 2014-02-03 LAB — CBC
HCT: 39.9 % (ref 36.0–46.0)
HEMOGLOBIN: 11.6 g/dL — AB (ref 12.0–15.0)
MCH: 26.5 pg (ref 26.0–34.0)
MCHC: 29.1 g/dL — ABNORMAL LOW (ref 30.0–36.0)
MCV: 91.3 fL (ref 78.0–100.0)
Platelets: 161 10*3/uL (ref 150–400)
RBC: 4.37 MIL/uL (ref 3.87–5.11)
RDW: 15.4 % (ref 11.5–15.5)
WBC: 5.2 10*3/uL (ref 4.0–10.5)

## 2014-02-03 MED ORDER — LORATADINE 10 MG PO TABS
10.0000 mg | ORAL_TABLET | Freq: Every day | ORAL | Status: DC
  Administered 2014-02-03 – 2014-02-04 (×2): 10 mg via ORAL
  Filled 2014-02-03 (×2): qty 1

## 2014-02-03 MED ORDER — ACETAMINOPHEN 325 MG PO TABS
650.0000 mg | ORAL_TABLET | Freq: Four times a day (QID) | ORAL | Status: DC | PRN
  Administered 2014-02-03: 650 mg via ORAL
  Filled 2014-02-03: qty 2

## 2014-02-03 MED ORDER — FLUTICASONE PROPIONATE 50 MCG/ACT NA SUSP
2.0000 | Freq: Every day | NASAL | Status: DC
  Administered 2014-02-03 – 2014-02-04 (×2): 2 via NASAL
  Filled 2014-02-03: qty 16

## 2014-02-03 MED ORDER — FUROSEMIDE 10 MG/ML IJ SOLN
40.0000 mg | Freq: Once | INTRAMUSCULAR | Status: AC
  Administered 2014-02-03: 40 mg via INTRAVENOUS
  Filled 2014-02-03: qty 4

## 2014-02-03 NOTE — Progress Notes (Signed)
Physical Therapy Treatment Patient Details Name: Eileen Cruz MRN: [id number] DOB: Jun 11, 1915 Today's Date: 02/03/2014    History of Present Illness 81 y.o. s/p fall at home. Has been feeling weak since the fall.    PT Comments    Pt progressing slowly with therapy.  Continues to require 4LO2 and with any exertion is dropping to upper 70's.  Requires up to 2 min to increase sats to 90%.  Practiced stairs for simulated home entry and pt requires up to mod A for safety.  Had lengthy discussion with pt and son regarding possible D/C to SNF for increased amount of therapy and medical supervision for safer D/C home.  Pt and son verbalized understanding and would like for CSW to speak with them.  Notified PA, RN and CSW of plan.    Follow Up Recommendations  SNF;Supervision/Assistance - 24 hour;Home health PT     Equipment Recommendations  None recommended by PT    Recommendations for Other Services       Precautions / Restrictions Precautions Precautions: Fall Precaution Comments: monitor O2 sats Restrictions Weight Bearing Restrictions: No    Mobility  Bed Mobility Overal bed mobility: Needs Assistance Bed Mobility: Supine to Sit     Supine to sit: Supervision;HOB elevated     General bed mobility comments: S for safety with HOB only slightly elevated and with use of handrails.  Requires increased time and effort to complete task.   Transfers Overall transfer level: Needs assistance Equipment used: Rolling walker (2 wheeled) Transfers: Sit to/from [employer] Sit to Stand: Min assist Stand pivot transfers: Min assist       General transfer comment: Requires cues for hand placement and assist to elevate hips and for forward weight shift.  Performed stand pivot transfer to 3in1 by bedside due to urgency during session at min A level with cues for sequencing and safety.   Ambulation/Gait Ambulation/Gait assistance: Min assist Ambulation Distance (Feet):  80 Feet Assistive device: Rolling walker (2 wheeled) Gait Pattern/deviations: Step-through pattern;Decreased stride length;Shuffle;Trunk flexed Gait velocity: slow   General Gait Details: Pt with very slow gait speed and demonstrates short shuffled steps.  Max cues for maintaining position inside of RW.  Note she requires min A for steadying throughout with cues for continued pursed lip breathing.    Stairs Stairs: Yes Stairs assistance: Mod assist Stair Management: One rail Right;Step to pattern;Sideways;Backwards (therapist provided HHA on L UE) Number of Stairs: 2 General stair comments: Pt stating that she may go home today or tomorrow, therefore wanted to assess safety with stairs.  Requires mod A to ascend forwards and descend backwards (due to lines).  Feel she would do better with B handrails (she has at home), however still requires hands on assist.  Son present during session for family training.    Wheelchair Mobility    Modified Rankin (Stroke Patients Only)       Balance Overall balance assessment: Needs assistance Sitting-balance support: Feet supported;Bilateral upper extremity supported Sitting balance-Leahy Scale: Good     Standing balance support: During functional activity;Single extremity supported Standing balance-Leahy Scale: Poor Standing balance comment: Requires hands on assist in order to assist with peri care                    Cognition Arousal/Alertness: Awake/alert Behavior During Therapy: [employer] for tasks assessed/performed Overall Cognitive Status: Within Functional Limits for tasks assessed  Exercises      General Comments General comments (skin integrity, edema, etc.): Pt continues to be on 4L O2 during mobility and during any exertion drops to upper 70's and requires up to 2 mins before increasing sats to 90%.        Pertinent Vitals/Pain Pain Assessment: No/denies pain    Home Living                       Prior Function            PT Goals (current goals can now be found in the care plan section) Acute Rehab PT Goals Patient Stated Goal: want to go home PT Goal Formulation: With patient Time For Goal Achievement: 02/08/14 Potential to Achieve Goals: Good Progress towards PT goals: Progressing toward goals    Frequency  Min 3X/week    PT Plan Discharge plan needs to be updated    Co-evaluation             End of Session Equipment Utilized During Treatment: Oxygen Activity Tolerance: Patient limited by fatigue Patient left: in chair;with call bell/phone within reach;with family/visitor present     Time: [id number] PT Time Calculation (min) (ACUTE ONLY): 42 min  Charges:  $Gait Training: 23-37 mins $Self Care/Home Management: 8-22                    G Codes:      Eileen Cruz, Eileen Cruz 02/03/2014, 10:57 AM

## 2014-02-03 NOTE — Progress Notes (Signed)
81 yo female who lives independently developed bilateral lower extremity edema.  She previously had similar symptoms 1 month ago and they cleared up with antibiotic therapy.  She is chronically SOB requiring 4L of oxygen inpatient.  She was not on oxygen prior to admission.  Per physical therapy patient desats into the 70s with exertion while wearing 4L of oxygen.  Subjective: No complaints.  Per son she is more congested than normal.  Patient agrees to go to [facility] for physical therapy after discharge as she is afraid of falling.  Objective: Vital signs in last 24 hours: Filed Vitals:   02/02/14 1332 02/02/14 2144 02/03/14 0606 02/03/14 1447  BP: 146/49 145/51 144/48 147/52  Pulse: 63 70 72 67  Temp: 98.8 F (37.1 C) 98.1 F (36.7 C) 97.4 F (36.3 C) 98.1 F (36.7 C)  TempSrc: Oral Oral Oral Oral  Resp: 18 24 20 18   Height:      Weight:      SpO2: 95% 99% 93% 100%   Weight change:   Intake/Output Summary (Last 24 hours) at 02/03/14 1635 Last data filed at 02/03/14 1100  Gross per 24 hour  Intake      0 ml  Output    900 ml  Net   -900 ml    Physical Exam: General: Awake, Oriented, No acute distress. Son is at bedside. HEENT: AT, [place], PERR. Neck: Supple, no JVD or lymphadenopathy. CV: S1 and S2, no frank m/r/g Lungs: decreased breath sounds.  Wet sounding cough Abdomen: Soft, Nontender, Nondistended, +bowel sounds. Ext: Good pulses. Trace edema.  Erythema in bilateral shin.  Wounds on knees bilaterally these are wrapped in clean gauze which I did not remove.  Lab Results: Basic Metabolic Panel:  Recent Labs Lab 01/31/14 1644 02/01/14 0505 02/02/14 0026 02/03/14 0544  NA 143 139 139 137  K 4.4 3.9 4.1 4.6  CL 96 97 93* 93*  CO2 31 35* 34* 35*  GLUCOSE 93 101* 110* 112*  BUN 29* 29* 35* 31*  CREATININE 1.49* 1.33* 1.31* 1.48*  CALCIUM 9.4 8.5 8.7 8.7   Liver Function Tests:  Recent Labs Lab 01/31/14 1644  AST 26  ALT 17  ALKPHOS 61  BILITOT  0.5  PROT 7.0  ALBUMIN 3.3*   CBC:  Recent Labs Lab 01/31/14 1644 02/01/14 0505 02/02/14 0026 02/03/14 0544  WBC 6.5 4.6 5.0 5.2  NEUTROABS 3.7  --   --   --   HGB 12.8 10.8* 11.4* 11.6*  HCT 43.1 37.2 38.3 39.9  MCV 90.0 90.3 92.1 91.3  PLT 213 170 175 161   Cardiac Enzymes:  Recent Labs Lab 01/31/14 1644  CKTOTAL 104  TROPONINI <0.03    Micro Results: Recent Results (from the past 240 hour(s))  Urine culture     Status: None   Collection Time: 01/31/14  5:34 PM  Result Value Ref Range Status   Specimen Description URINE, CLEAN CATCH  Final   Special Requests NONE  Final   Colony Count   Final    1,000 COLONIES/ML Performed at [facility]    Culture   Final    INSIGNIFICANT GROWTH Performed at [facility]    Report Status 02/01/2014 FINAL  Final    Studies/Results: No results found.  Medications: I have reviewed the patient's current medications. Scheduled Meds: . amLODipine  5 mg Oral Daily  . antiseptic oral rinse  7 mL Mouth Rinse BID  . aspirin  325 mg Oral  Daily  . clopidogrel  75 mg Oral Daily  . fluticasone  2 spray Each Nare Daily  . furosemide  40 mg Oral Daily  . heparin  5,000 Units Subcutaneous 3 times per day  . loratadine  10 mg Oral Daily  . pravastatin  20 mg Oral q1800  . silver sulfADIAZINE   Topical Daily  . vancomycin  500 mg Intravenous Q24H   Continuous Infusions:  PRN Meds:.acetaminophen, albuterol  Assessment/Plan: Cellulitis of BLE Patient on vancomycin.   Continue wound care, appreciate their input. "Cleanse bilateral knees with NS and pat gently dry. Apply Silvadene cream to wound bed. Cover with nonadherent telfa pad and 4x4 gauze. Secure with kerlix and tape. Change daily." -X-ray of right foot negative.  Chronic hypoxic respiratory failure due to COPD -Stable -Not in acute exacerbation.  Will add claritin and flonase as the patient appears congested.  Coronary artery  disease Stable.  Diastolic dysfunction 2D Echo 02/03/2014. Results below.  Will continue oral lasix and monitor closely.  Hypertension Stable.  Chronic kidney disease stage III/IV Creatinine slowly trending up (on vanc), but baseline appears to be approx 1.5.  Generalized weakness Physical therapy recommending SNF.   Prophylaxis Subcutaneous heparin.  CODE STATUS Full code.  Will discuss code status with family on 1/14.  Disposition Continue as inpatient.  Anti-infectives    Start     Dose/Rate Route Frequency Ordered Stop   02/03/14 1200  vancomycin (VANCOCIN) 500 mg in sodium chloride 0.9 % 100 mL IVPB     500 mg100 mL/hr over 60 Minutes Intravenous Every 24 hours 02/02/14 1058     02/02/14 1100  vancomycin (VANCOCIN) IVPB 1000 mg/200 mL premix     1,000 mg200 mL/hr over 60 Minutes Intravenous  Once 02/02/14 1046 02/02/14 1345   01/31/14 2015  clindamycin (CLEOCIN) IVPB 600 mg  Status:  Discontinued     600 mg100 mL/hr over 30 Minutes Intravenous 4 times per day 01/31/14 2006 02/02/14 1046        LOS: 3 days  Conley Canal [phone] 02/03/2014, 4:35 PM

## 2014-02-03 NOTE — Progress Notes (Signed)
Occupational Therapy Treatment Patient Details Name: Eileen Cruz MRN: [id number] DOB: 1916-01-04 Today's Date: 02/03/2014    History of present illness 81 y.o. s/p fall at home. Has been feeling weak since the fall.   OT comments  Pt's O2 dropping in session on 4-6L of O2 (see vitals). Recommending SNF for rehab and briefly talked about with pt's son.  Follow Up Recommendations  SNF;Supervision/Assistance - 24 hour    Equipment Recommendations  None recommended by OT    Recommendations for Other Services      Precautions / Restrictions Precautions Precautions: Fall Precaution Comments: monitor O2 sats Restrictions Weight Bearing Restrictions: No       Mobility Bed Mobility Overal bed mobility: Needs Assistance Bed Mobility: Supine to Sit;Sit to Supine     Supine to sit: Supervision Sit to supine: Supervision   General bed mobility comments: cues for technique to scoot HOB  Transfers Overall transfer level: Needs assistance Equipment used: Rolling walker (2 wheeled) Transfers: Sit to/from Stand Sit to Stand: Min guard       General transfer comment: cues for hand placement/technique.        ADL Overall ADL's : Needs assistance/impaired     Grooming: Wash/dry hands;Wash/dry face;Oral care;Applying deodorant;Set up;Supervision/safety;Standing   Upper Body Bathing: Set up;Supervision/ safety;Standing   Lower Body Bathing: Minimal assistance;Sit to/from stand (assist with feet)   Upper Body Dressing :  (OT assisted pt with gown)   Lower Body Dressing: Total assistance;Sitting/lateral leans (socks)   Toilet Transfer: Min guard;Ambulation;RW;BSC   Toileting- Clothing Manipulation and Hygiene: Set up;Supervision/safety;Sit to/from stand       Functional mobility during ADLs: Min guard;Rolling walker General ADL Comments: Educated on deep breathing technique and energy conservation tips. Educated on safety such as using bag/tray and additional safety  tips. Explained there is AE available.  Pt states she can access feet better at home on what she usually sits on.      Vision                     Perception     Praxis      Cognition  Awake/Alert Behavior During Therapy: WFL for tasks assessed/performed Overall Cognitive Status: Within Functional Limits for tasks assessed                       Extremity/Trunk Assessment               Exercises     Shoulder Instructions       General Comments      Pertinent Vitals/ Pain       Pain Assessment: No/denies pain; Pt initially on 4L of O2 and once sitting EOB pt's O2 in 70's. Cues for deep breathing technique and OT turned up O2 to 5L to try to get it to come up-trended up with deep breathing. Turned up pt's O2 to 6L during session as it was dropping (pulse oximeter read 50's-80's). O2 at 95% at end of session on 6L of O2. (unsure of accuracy of O2-changed with different fingers)  Home Living                                          Prior Functioning/Environment              Frequency Min 2X/week     Progress Toward Goals  OT Goals(current goals can now be found in the care plan section)  Progress towards OT goals: Progressing toward goals  Acute Rehab OT Goals Patient Stated Goal: not stated OT Goal Formulation: With patient Time For Goal Achievement: 02/08/14 Potential to Achieve Goals: Good ADL Goals Pt Will Perform Grooming: with set-up;standing Pt Will Perform Upper Body Bathing: with set-up;standing Pt Will Perform Lower Body Bathing: sit to/from stand;with min assist Pt Will Perform Lower Body Dressing: with min assist;sit to/from stand Pt Will Transfer to Toilet: ambulating;with modified independence Pt Will Perform Toileting - Clothing Manipulation and hygiene: sit to/from stand;with modified independence  Plan Discharge plan needs to be updated    Co-evaluation                 End of Session Equipment  Utilized During Treatment: Gait belt;Rolling walker;Oxygen   Activity Tolerance Other (comment) (cardiopulmonary status)   Patient Left in bed;with call bell/phone within reach;with bed alarm set;with family/visitor present   Nurse Communication Other (comment) (O2 sats)        Time: [id number] OT Time Calculation (min): 32 min  Charges: OT General Charges $OT Visit: 1 Procedure OT Treatments $Self Care/Home Management : 23-37 mins  Jenell MillinerStraub, Massie Cogliano LOTR/L [phone] 02/03/2014, 2:43 PM

## 2014-02-03 NOTE — Progress Notes (Signed)
  Echocardiogram 2D Echocardiogram has been performed.  Janalyn HarderWest, Ashyra Cantin R 02/03/2014, 12:14 PM

## 2014-02-03 NOTE — Clinical Social Work Psychosocial (Signed)
Clinical Social Work Department BRIEF PSYCHOSOCIAL ASSESSMENT 02/03/2014  Patient:  Eileen Cruz, Eileen Cruz     Account Number:  [id number]     Admit date:  01/31/2014  Clinical Social Worker:  Lovey Newcomer  Date/Time:  02/03/2014 03:54 PM  Referred by:  Physician  Date Referred:  02/03/2014 Referred for  SNF Placement   Other Referral:   NA   Interview type:  Patient Other interview type:   Patient and patient's son interviewed to complete assessment.    PSYCHOSOCIAL DATA Living Status:  ALONE Admitted from facility:   Level of care:   Primary support name:  JOhn Primary support relationship to patient:  CHILD, ADULT Degree of support available:   Support is strong.    CURRENT CONCERNS Current Concerns  Post-Acute Placement   Other Concerns:   NA    SOCIAL WORK ASSESSMENT / PLAN CSW met with patient at bedside and spoke with patient's son Eileen Cruz by phone to complete assessment. Patient was admitted from home alone where she has been able to complete her ADLs independently. Patient and Eileen Cruz agree that SNF placement at discharge would be [name] to optimize the patient recovery and ability to return home independently. CSW explained SNF search/placement process and answered questions. Patient and son were both engaged in assessment and calm.   Assessment/plan status:  Psychosocial Support/Ongoing Assessment of Needs Other assessment/ plan:   Complete Fl2, Fax, pASRR   Information/referral to community resources:   CSW contact information and SNF list given.    PATIENT'S/FAMILY'S RESPONSE TO PLAN OF CARE: Patient and patient's son agreeable to SNF placement at discharge. CSW will follow wup with available bed offers.       Liz Beach MSW, [place], [place], [phone]

## 2014-02-03 NOTE — Clinical Social Work Psychosocial (Deleted)
Clinical Social Work Department BRIEF PSYCHOSOCIAL ASSESSMENT 02/03/2014  Patient:  Lonna Cobb     Account Number:  [id number]     Admit date:  02/02/2014  Clinical Social Worker:  Lovey Newcomer  Date/Time:  02/03/2014 03:20 PM  Referred by:  Physician  Date Referred:  02/03/2014 Referred for  SNF Placement   Other Referral:   NA   Interview type:  Family Other interview type:   Patient's wife interviewed at bedside.    PSYCHOSOCIAL DATA Living Status:  WIFE Admitted from facility:   Level of care:   Primary support name:  Benjamine Mola Primary support relationship to patient:  SPOUSE Degree of support available:   Support is good.    CURRENT CONCERNS Current Concerns  Post-Acute Placement   Other Concerns:   NA    SOCIAL WORK ASSESSMENT / PLAN CSW met with patient's wife at bedside to complete assessment. Patient's wife Benjamine Mola states that the patient was admitted from home where he receives respite care through the [place]. Benjamine Mola states that these services will end on 02/28/14 and requests information on home health services available to patient (RNCM made aware). Benjamine Mola states that she is interested in knowing the SNF benefits for her husband, but does not intend on having the patient placed at discharge. CSW will inform wife of SNF benefits once insurance company calls back. Benjamine Mola states that she is able to take care of the patient with the assistance he is getting from the [place].   Assessment/plan status:  Psychosocial Support/Ongoing Assessment of Needs Other assessment/ plan:   NONE   Information/referral to community resources:   CSW will provide patient's wife with information on SNF benefits.    PATIENT'S/FAMILY'S RESPONSE TO PLAN OF CARE: Patient's wife plans for patient to DC home when medically stable. CSW has made RNCM aware. CSW will sign off.       Liz Beach MSW, [place], [place], [phone]

## 2014-02-04 LAB — BASIC METABOLIC PANEL
Anion gap: 17 — ABNORMAL HIGH (ref 5–15)
BUN: 29 mg/dL — AB (ref 6–23)
CHLORIDE: 88 meq/L — AB (ref 96–112)
CO2: 39 mmol/L — ABNORMAL HIGH (ref 19–32)
CREATININE: 1.24 mg/dL — AB (ref 0.50–1.10)
Calcium: 9.7 mg/dL (ref 8.4–10.5)
GFR calc Af Amer: 41 mL/min — ABNORMAL LOW (ref >90)
GFR calc non Af Amer: 35 mL/min — ABNORMAL LOW (ref >90)
GLUCOSE: 102 mg/dL — AB (ref 70–99)
POTASSIUM: 4.6 mmol/L (ref 3.5–5.1)
SODIUM: 144 mmol/L (ref 135–145)

## 2014-02-04 MED ORDER — CETYLPYRIDINIUM CHLORIDE 0.05 % MT LIQD: 7.0000 mL | Freq: Two times a day (BID) | OROMUCOSAL | Status: AC

## 2014-02-04 MED ORDER — ALBUTEROL SULFATE (2.5 MG/3ML) 0.083% IN NEBU: 2.5000 mg | INHALATION_SOLUTION | RESPIRATORY_TRACT | Status: DC | PRN

## 2014-02-04 MED ORDER — DOXYCYCLINE HYCLATE 100 MG PO TABS: 100.0000 mg | ORAL_TABLET | Freq: Two times a day (BID) | ORAL | Status: DC

## 2014-02-04 MED ORDER — LORATADINE 10 MG PO TABS: 10.0000 mg | ORAL_TABLET | Freq: Every day | ORAL | Status: AC

## 2014-02-04 MED ORDER — ACETAMINOPHEN 325 MG PO TABS: 650.0000 mg | ORAL_TABLET | Freq: Four times a day (QID) | ORAL | Status: AC | PRN

## 2014-02-04 MED ORDER — FLUTICASONE PROPIONATE 50 MCG/ACT NA SUSP: 2.0000 | Freq: Every day | NASAL | Status: AC

## 2014-02-04 MED ORDER — DOXYCYCLINE HYCLATE 100 MG PO TABS
100.0000 mg | ORAL_TABLET | Freq: Two times a day (BID) | ORAL | Status: DC
  Administered 2014-02-04: 100 mg via ORAL
  Filled 2014-02-04 (×2): qty 1

## 2014-02-04 NOTE — Progress Notes (Signed)
02/04/2014 Patient to be discharged to [facility] for Rehab, IV site to be removed, family aware of move.

## 2014-02-04 NOTE — Discharge Summary (Signed)
Physician Discharge Summary  Eileen Cruz ZOX:[id number] DOB: 1915/12/04 DOA: 01/31/2014  PCP: Kaleen Mask, MD  Admit date: 01/31/2014 Discharge date: 02/04/2014  Time spent: 45 minutes  Recommendations for Outpatient Follow-up:  1. Patient placed on oxygen 24/7 nasal canula.  Requires 2L to avoid hypoxia.  Please reassess as appropriate. 2. Patient with bilateral lower extremity wounds and cellulitis.  Will need wound care and completion of antibiotics for cellulitis. completes abx on 1/25 ( total 2 week course).  3. bmet / cbc in 5-7 days.  Discharge Diagnoses:  Principal Problem:   Cellulitis of b/l  leg   Active Problems:   Hypoxemia   COPD (chronic obstructive pulmonary disease)   Discharge Condition: stable.    Diet recommendation: Heart Healthy  Code status: DNR   Filed Weights   01/31/14 1627  Weight: 73.483 kg (162 lb)    History of present illness:  81 yo female who lives independently developed bilateral lower extremity erythema and edema. She previously had similar symptoms 1 month ago and they cleared up with antibiotic therapy.She has also had recent falls at home, sustaining wounds to her knees.   Hospital Course:  Cellulitis of bilateral lower extremities. Patient was treated inpatient with IV vancomycin.  The erythema has begun to recede. Wound care consulted and made recommendations which have been implemented.  Would continue wound care,  "Cleanse bilateral knees with NS and pat gently dry. Apply Silvadene cream to wound bed. Cover with nonadherent telfa pad and 4x4 gauze. Secure with kerlix and tape. Change daily."   X-ray of right foot negative.  Chronic hypoxic respiratory failure due to COPD Patient desatted to 70% with ambulation on 4 Liters of oxygen. Currently at rest she requires 2L to avoid hypoxia.  It appears that the patient was chronically hypoxic at home as she did not have an acute COPD exacerbation this admission, and, she  was not on oxygen prior to admission. She has been treated with nebulizers PRN.  Claritin and flonase were added as she became congested.  She may benefit from a pulmonology follow up outpatient to optimize her breathing.    Coronary artery disease Stable.  Diastolic dysfunction 2D Echo 02/03/2014. Results below. Will continue oral lasix.   Patient appeared to breath more easily on 1/14 after receiving one dose of IV lasix 40 mg on 1/13.    Hypertension Stable.  Chronic kidney disease stage III/IV Creatinine at or below baseline 1.5.  Generalized weakness Physical therapy recommending SNF.  Family and patient are agreeable to SNF.   Discussed GOC with son at bedside, who did not wish for any heroic measures including mechanical ventilation or cardia resuscitation. patient  made DNR.   Procedures:  2D echo   Study Conclusions  - Left ventricle: The cavity size was normal. Wall thickness was increased in a pattern of mild LVH. The estimated ejection  fraction was 60%. Wall motion was normal; there were no regional wall motion abnormalities. Findings consistent with left ventricular diastolic dysfunction. Doppler parameters are consistent with high ventricular filling pressure. - Aortic valve: Mildly thickened leaflets. Very mild AS. Trace AI. - Mitral valve: Moderately calcified annulus. There was no evidence for stenosis. - Left atrium: The atrium was mildly dilated. - Right ventricle: The cavity size was normal. Systolic function was normal.  Consultations: None.  Discharge Exam: Filed Vitals:   02/03/14 1447 02/03/14 2021 02/03/14 2022 02/04/14 0516  BP: 147/52 153/53  170/54  Pulse: 67 77 79 73  Temp:  98.1 F (36.7 C) 98.4 F (36.9 C)  98.8 F (37.1 C)  TempSrc: Oral Oral  Oral  Resp: Height:      Weight:      SpO2: 100% 95% 95% 95%   General: Awake, Oriented, No acute distress.  HEENT: AT, , PERR. Neck: Supple, no JVD or lymphadenopathy. CV: S1  and S2, no frank m/r/g Lungs: decreased breath sounds.no increased work of breathing.   Abdomen: Soft, Nontender, Nondistended, +bowel sounds. Ext: Good pulses. Trace edema. Erythema in bilateral shins  Receeding from traced outline.   Discharge Instructions   Discharge Instructions    Diet - low sodium heart healthy    Complete by:  As directed      Increase activity slowly    Complete by:  As directed           Current Discharge Medication List    START taking these medications   Details  acetaminophen (TYLENOL) 325 MG tablet Take 2 tablets (650 mg total) by mouth every 6 (six) hours as needed for mild pain.    albuterol (PROVENTIL) (2.5 MG/3ML) 0.083% nebulizer solution Take 3 mLs (2.5 mg total) by nebulization every 4 (four) hours as needed for wheezing or shortness of breath. Qty: 75 mL, Refills: 12    antiseptic oral rinse (CPC / CETYLPYRIDINIUM CHLORIDE 0.05%) 0.05 % LIQD solution 7 mLs by Mouth Rinse route 2 (two) times daily. Refills: 0    doxycycline (VIBRA-TABS) 100 MG tablet Take 1 tablet (100 mg total) by mouth every 12 (twelve) hours. Qty: 18 tablet, Refills: 0    fluticasone (FLONASE) 50 MCG/ACT nasal spray Place 2 sprays into both nostrils daily. Qty: 16 g, Refills: 2    loratadine (CLARITIN) 10 MG tablet Take 1 tablet (10 mg total) by mouth daily.      CONTINUE these medications which have NOT CHANGED   Details  albuterol (PROVENTIL HFA;VENTOLIN HFA) 108 (90 BASE) MCG/ACT inhaler Inhale 2 puffs into the lungs every 4 (four) hours as needed for wheezing or shortness of breath.     amLODipine (NORVASC) 5 MG tablet Take 5 mg by mouth daily.    aspirin 325 MG tablet Take 325 mg by mouth daily.    clopidogrel (PLAVIX) 75 MG tablet Take 1 tablet (75 mg total) by mouth daily. Qty: 30 tablet, Refills: 5    furosemide (LASIX) 40 MG tablet Take 40 mg by mouth daily.    lovastatin (MEVACOR) 20 MG tablet Take 20 mg by mouth at bedtime.       Allergies   Allergen Reactions  . Beta Adrenergic Blockers Other (See Comments)    REACTION: bradycardia   Follow-up Information    Follow up with Barbaraann Share, MD In 1 week.   Specialty:  Pulmonary Disease   Contact information:   [address] [place] [address] [phone]       Follow up with Kaleen Mask, MD In 3 weeks.   Specialty:  Family Medicine   Contact information:   [address] [place] [address] [phone]        The results of significant diagnostics from this hospitalization (including imaging, microbiology, ancillary and laboratory) are listed below for reference.    Significant Diagnostic Studies: Dg Chest Port 1 View  01/31/2014   CLINICAL DATA:  Shortness of breath.  History of hypertension.  EXAM: PORTABLE CHEST - 1 VIEW  COMPARISON:  Chest radiograph 04/02/2008  FINDINGS: Multiple leads overlie the  patient. Stable cardiomegaly. Minimal heterogeneous opacities within the left and right lower lungs. No definite pleural effusion or pneumothorax.  IMPRESSION: Cardiomegaly.  Minimal bilateral lower lung heterogeneous opacities suggestive of atelectasis. Infection not excluded.   Electronically Signed   By: Annia Beltrew  Davis M.D.   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 Pronounced hallux valgus deformity. Small heel spur. Degenerative changes at the tarsometatarsal level dorsally. Diffuse midfoot soft tissue swelling and hindfoot soft tissue swelling. Small vessel calcification.  IMPRESSION: Chronic changes with no acute findings. Limited evaluation of the bases of the metatarsals in the absence of oblique image.   Electronically Signed   By: Esperanza Heiraymond  Rubner M.D.   On: 01/31/2014 21:22     Microbiology: Recent Results (from the past 240 hour(s))  Urine culture     Status: None   Collection Time: 01/31/14  5:34 PM  Result Value Ref Range Status   Specimen Description URINE, CLEAN CATCH  Final   Special Requests NONE  Final   Colony Count   Final    1,000 COLONIES/ML Performed at [facility]    Culture   Final    INSIGNIFICANT GROWTH Performed at [facility]    Report Status 02/01/2014 FINAL  Final     Labs: Basic Metabolic Panel:  Recent Labs Lab 01/31/14 1644 02/01/14 0505 02/02/14 0026 02/03/14 0544 02/04/14 0720  NA 143 139 139 137 144  K 4.4 3.9 4.1 4.6 4.6  CL 96 97 93* 93* 88*  CO2 31 35* 34* 35* 39*  GLUCOSE 93 101* 110* 112* 102*  BUN 29* 29* 35* 31* 29*  CREATININE 1.49* 1.33* 1.31* 1.48* 1.24*  CALCIUM 9.4 8.5 8.7 8.7 9.7   Liver Function Tests:  Recent Labs Lab 01/31/14 1644  AST 26  ALT 17  ALKPHOS 61  BILITOT 0.5  PROT 7.0  ALBUMIN 3.3*   CBC:  Recent Labs Lab 01/31/14 1644 02/01/14 0505 02/02/14 0026 02/03/14 0544  WBC 6.5 4.6 5.0 5.2  NEUTROABS 3.7  --   --   --   HGB 12.8 10.8* 11.4* 11.6*  HCT 43.1 37.2 38.3 39.9  MCV 90.0 90.3 92.1 91.3  PLT 213 170 175 161   Cardiac Enzymes:  Recent Labs Lab 01/31/14 1644  CKTOTAL 104  TROPONINI <0.03    Signed:  Stephani PoliceYork, Marianne L, PA-C  [facility] 02/04/2014, 12:00 PM

## 2014-02-04 NOTE — Clinical Social Work Placement (Signed)
Clinical Social Work Department CLINICAL SOCIAL WORK PLACEMENT NOTE 02/04/2014  Patient:  Eileen Cruz,Eileen Cruz  Account Number:  [id number] Admit date:  01/31/2014  Clinical Social Worker:  Cherre BlancJOSEPH BRYANT [name], [place]  Date/time:  02/04/2014 03:53 PM  Clinical Social Work is seeking post-discharge placement for this patient at the following level of care:   SKILLED NURSING   (*CSW will update this form in Epic as items are completed)   02/03/2014  Patient/family provided with [facility] of Clinical Social Work's list of facilities offering this level of care within the geographic area requested by the patient (or if unable, by the patient's family).  02/03/2014  Patient/family informed of their freedom to choose among providers that offer the needed level of care, that participate in Medicare, Medicaid or managed care program needed by the patient, have an available bed and are willing to accept the patient.  02/03/2014  Patient/family informed of [facility]' ownership interest in [facility], as well as of the fact that they are under no obligation to receive care at this facility.  PASARR submitted to EDS on 02/03/2014 PASARR number received on 02/03/2014  FL2 transmitted to all facilities in geographic area requested by pt/family on  02/03/2014 FL2 transmitted to all facilities within larger geographic area on   Patient informed that his/her managed care company has contracts with or will negotiate with  certain facilities, including the following:     Patient/family informed of bed offers received:  02/04/2014 [facility] [place] Physician recommends and patient chooses bed at    [facility] [place] [facility] on  02/04/2014 Patient to be transferred to facility by Ambulance Patient and family notified of transfer on 02/04/2014 Name of family member notified:  Jonny RuizJohn  The  following physician request were entered in Epic:   Additional Comments:  Per MD patient ready for DC to [facility]. RN, patient, patient's family, and facility notified of DC. RN given number for report. DC packet on chart. [facility] transport requested for patient for 2:30PM. CSW signing off.   Roddie McBryant Xariah Silvernail MSW, [place], [place], [phone]

## 2014-02-04 NOTE — Care Management Note (Signed)
    Page 1 of 1   02/04/2014     4:37:34 PM CARE MANAGEMENT NOTE 02/04/2014  Patient:  Eileen Cruz,Eileen Cruz   Account Number:  [id number]  Date Initiated:  02/03/2014  Documentation initiated by:  [facility],ALESIA  Subjective/Objective Assessment:   Cellulitis of BLE     Action/Plan:   Anticipated DC Date:  02/04/2014   Anticipated DC Plan:  SKILLED NURSING FACILITY  In-house referral  Clinical Social Worker      DC Planning Services  CM consult      Choice offered to / List presented to:             Status of service:  Completed, signed off Medicare Important Message given?  YES (If response is "NO", the following Medicare IM given date fields will be blank) Date Medicare IM given:  02/04/2014 Medicare IM given by:  Letha CapeAYLOR,Joron Velis Date Additional Medicare IM given:   Additional Medicare IM given by:    Discharge Disposition:  SKILLED NURSING FACILITY  Per UR Regulation:  Reviewed for med. necessity/level of care/duration of stay  If discussed at Long Length of Stay Meetings, dates discussed:    Comments:

## 2014-03-02 ENCOUNTER — Institutional Professional Consult (permissible substitution): Payer: Medicare Other | Admitting: Pulmonary Disease

## 2014-03-24 ENCOUNTER — Ambulatory Visit (INDEPENDENT_AMBULATORY_CARE_PROVIDER_SITE_OTHER): Payer: Medicare Other | Admitting: Pulmonary Disease

## 2014-03-24 ENCOUNTER — Encounter: Payer: Self-pay | Admitting: Pulmonary Disease

## 2014-03-24 VITALS — BP 122/76 | HR 67 | Temp 97.6°F | Ht 59.0 in | Wt 163.4 lb

## 2014-03-24 DIAGNOSIS — J9611 Chronic respiratory failure with hypoxia: Secondary | ICD-10-CM

## 2014-03-24 NOTE — Progress Notes (Signed)
   Subjective:    Patient ID: Eileen GraffMarion L Hineman, female    DOB: 10/13/1915, 81 y.o.   MRN: [id number]  HPI The patient comes in today for follow-up after a recent hospitalization for acute on chronic respiratory failure after a fall. She had no acute process on her chest x-ray, but her echo did show significant diastolic dysfunction with evidence for high left ventricular filling pressures. Was diuresed during her hospitalization, and had adequate saturations at discharge. For some reason, she keeps being labeled as having severe COPD, although she has minimal smoking history. Her hypoxemia and hypercarbia have always been better explained by her chest wall restriction from kyphoscoliosis and also her centripetal obesity. Obviously, her diastolic dysfunction and age contribute to this issue as well. She currently is doing well after rehabilitation, and feels that her breathing is back to baseline. She denies any significant cough or mucus production.   Review of Systems  Constitutional: Negative for fever and unexpected weight change.  HENT: Negative for congestion, dental problem, ear pain, nosebleeds, postnasal drip, rhinorrhea, sinus pressure, sneezing, sore throat and trouble swallowing.   Eyes: Negative for redness and itching.  Respiratory: Positive for shortness of breath. Negative for cough, chest tightness and wheezing.   Cardiovascular: Positive for leg swelling. Negative for palpitations.  Gastrointestinal: Negative for nausea and vomiting.  Genitourinary: Negative for dysuria.  Musculoskeletal: Positive for arthralgias. Negative for joint swelling.  Skin: Negative for rash.  Neurological: Negative for headaches.  Hematological: Does not bruise/bleed easily.  Psychiatric/Behavioral: Negative for dysphoric mood. The patient is not nervous/anxious.        Objective:   Physical Exam Overweight female in no acute distress Nose without purulence or discharge noted Neck without  lymphadenopathy or thyromegaly Chest with a few basilar crackles, otherwise clear Cardiac exam with regular rate and rhythm Lower extremities with 1+ edema, no cyanosis Alert and oriented, moves all 4 extremities.       Assessment & Plan:

## 2014-03-24 NOTE — Patient Instructions (Signed)
Stay on your oxygen  Use your incentive spirometer to help your exercise your lungs, and work on weight loss Stay as active as possible. followup with me as needed.

## 2014-03-24 NOTE — Assessment & Plan Note (Signed)
The patient has known chronic respiratory failure with both hypoxemic and hypercarbic components that date back to at least 2010 with mild initial visit. This is felt to be multifactorial, and related to her kyphoscoliosis as well as centripetal obesity. The patient also has known diastolic heart failure, with recent echo during hospitalization in January showing evidence for high left ventricular filling pressures and significant diastolic dysfunction. It is extremely unlikely this patient has significant COPD, given her very little smoking history. I have recommended that she continue on her oxygen, work on modest weight loss, keep using her incentive spirometer to help with her breathing, and to maintain euvolemic as much as possible.

## 2014-07-06 ENCOUNTER — Other Ambulatory Visit: Payer: Self-pay | Admitting: Cardiology

## 2014-07-06 ENCOUNTER — Other Ambulatory Visit: Payer: Self-pay | Admitting: *Deleted

## 2014-11-18 ENCOUNTER — Other Ambulatory Visit: Payer: Self-pay | Admitting: Family Medicine

## 2014-11-18 ENCOUNTER — Ambulatory Visit
Admission: RE | Admit: 2014-11-18 | Discharge: 2014-11-18 | Disposition: A | Discharge status: Home / Self Care | Payer: Medicare Other | Source: Ambulatory Visit | Attending: Family Medicine | Admitting: Family Medicine

## 2014-11-18 DIAGNOSIS — R079 Chest pain, unspecified: Secondary | ICD-10-CM

## 2014-11-18 DIAGNOSIS — R0989 Other specified symptoms and signs involving the circulatory and respiratory systems: Secondary | ICD-10-CM

## 2014-11-30 ENCOUNTER — Emergency Department (HOSPITAL_COMMUNITY): Payer: Medicare Other

## 2014-11-30 ENCOUNTER — Inpatient Hospital Stay (HOSPITAL_COMMUNITY)
Admission: EM | Admit: 2014-11-30 | Discharge: 2014-12-23 | DRG: 682 | Disposition: E | Payer: Medicare Other | Attending: Internal Medicine | Admitting: Internal Medicine

## 2014-11-30 ENCOUNTER — Encounter (HOSPITAL_COMMUNITY): Payer: Self-pay | Admitting: *Deleted

## 2014-11-30 ENCOUNTER — Inpatient Hospital Stay (HOSPITAL_COMMUNITY): Payer: Medicare Other

## 2014-11-30 DIAGNOSIS — N183 Chronic kidney disease, stage 3 (moderate): Secondary | ICD-10-CM | POA: Diagnosis present

## 2014-11-30 DIAGNOSIS — Z8679 Personal history of other diseases of the circulatory system: Secondary | ICD-10-CM

## 2014-11-30 DIAGNOSIS — I701 Atherosclerosis of renal artery: Secondary | ICD-10-CM | POA: Diagnosis present

## 2014-11-30 DIAGNOSIS — D61818 Other pancytopenia: Secondary | ICD-10-CM | POA: Diagnosis present

## 2014-11-30 DIAGNOSIS — I739 Peripheral vascular disease, unspecified: Secondary | ICD-10-CM | POA: Diagnosis present

## 2014-11-30 DIAGNOSIS — J9621 Acute and chronic respiratory failure with hypoxia: Secondary | ICD-10-CM | POA: Diagnosis present

## 2014-11-30 DIAGNOSIS — I10 Essential (primary) hypertension: Secondary | ICD-10-CM | POA: Diagnosis present

## 2014-11-30 DIAGNOSIS — I130 Hypertensive heart and chronic kidney disease with heart failure and stage 1 through stage 4 chronic kidney disease, or unspecified chronic kidney disease: Secondary | ICD-10-CM | POA: Diagnosis present

## 2014-11-30 DIAGNOSIS — N170 Acute kidney failure with tubular necrosis: Secondary | ICD-10-CM | POA: Diagnosis present

## 2014-11-30 DIAGNOSIS — R680 Hypothermia, not associated with low environmental temperature: Secondary | ICD-10-CM | POA: Diagnosis not present

## 2014-11-30 DIAGNOSIS — M1A9XX1 Chronic gout, unspecified, with tophus (tophi): Secondary | ICD-10-CM | POA: Diagnosis present

## 2014-11-30 DIAGNOSIS — I2510 Atherosclerotic heart disease of native coronary artery without angina pectoris: Secondary | ICD-10-CM | POA: Diagnosis present

## 2014-11-30 DIAGNOSIS — M7989 Other specified soft tissue disorders: Secondary | ICD-10-CM | POA: Diagnosis present

## 2014-11-30 DIAGNOSIS — M40209 Unspecified kyphosis, site unspecified: Secondary | ICD-10-CM | POA: Diagnosis present

## 2014-11-30 DIAGNOSIS — E869 Volume depletion, unspecified: Secondary | ICD-10-CM | POA: Diagnosis present

## 2014-11-30 DIAGNOSIS — E8809 Other disorders of plasma-protein metabolism, not elsewhere classified: Secondary | ICD-10-CM | POA: Diagnosis present

## 2014-11-30 DIAGNOSIS — I83013 Varicose veins of right lower extremity with ulcer of ankle: Secondary | ICD-10-CM | POA: Diagnosis present

## 2014-11-30 DIAGNOSIS — D696 Thrombocytopenia, unspecified: Secondary | ICD-10-CM | POA: Diagnosis present

## 2014-11-30 DIAGNOSIS — E669 Obesity, unspecified: Secondary | ICD-10-CM | POA: Diagnosis present

## 2014-11-30 DIAGNOSIS — Z9981 Dependence on supplemental oxygen: Secondary | ICD-10-CM | POA: Diagnosis not present

## 2014-11-30 DIAGNOSIS — N390 Urinary tract infection, site not specified: Secondary | ICD-10-CM | POA: Diagnosis present

## 2014-11-30 DIAGNOSIS — L03116 Cellulitis of left lower limb: Secondary | ICD-10-CM | POA: Diagnosis present

## 2014-11-30 DIAGNOSIS — N009 Acute nephritic syndrome with unspecified morphologic changes: Secondary | ICD-10-CM | POA: Diagnosis present

## 2014-11-30 DIAGNOSIS — I959 Hypotension, unspecified: Secondary | ICD-10-CM | POA: Diagnosis not present

## 2014-11-30 DIAGNOSIS — J9610 Chronic respiratory failure, unspecified whether with hypoxia or hypercapnia: Secondary | ICD-10-CM | POA: Diagnosis present

## 2014-11-30 DIAGNOSIS — Z9861 Coronary angioplasty status: Secondary | ICD-10-CM | POA: Diagnosis not present

## 2014-11-30 DIAGNOSIS — A419 Sepsis, unspecified organism: Secondary | ICD-10-CM | POA: Diagnosis not present

## 2014-11-30 DIAGNOSIS — N179 Acute kidney failure, unspecified: Secondary | ICD-10-CM | POA: Diagnosis present

## 2014-11-30 DIAGNOSIS — Z66 Do not resuscitate: Secondary | ICD-10-CM | POA: Diagnosis present

## 2014-11-30 DIAGNOSIS — R0602 Shortness of breath: Secondary | ICD-10-CM

## 2014-11-30 DIAGNOSIS — L97319 Non-pressure chronic ulcer of right ankle with unspecified severity: Secondary | ICD-10-CM | POA: Diagnosis present

## 2014-11-30 DIAGNOSIS — D509 Iron deficiency anemia, unspecified: Secondary | ICD-10-CM | POA: Diagnosis present

## 2014-11-30 DIAGNOSIS — D649 Anemia, unspecified: Secondary | ICD-10-CM | POA: Diagnosis present

## 2014-11-30 DIAGNOSIS — Z87891 Personal history of nicotine dependence: Secondary | ICD-10-CM

## 2014-11-30 DIAGNOSIS — I350 Nonrheumatic aortic (valve) stenosis: Secondary | ICD-10-CM | POA: Diagnosis present

## 2014-11-30 DIAGNOSIS — J984 Other disorders of lung: Secondary | ICD-10-CM | POA: Diagnosis present

## 2014-11-30 DIAGNOSIS — I5032 Chronic diastolic (congestive) heart failure: Secondary | ICD-10-CM | POA: Diagnosis present

## 2014-11-30 DIAGNOSIS — J9611 Chronic respiratory failure with hypoxia: Secondary | ICD-10-CM | POA: Diagnosis not present

## 2014-11-30 DIAGNOSIS — L8990 Pressure ulcer of unspecified site, unspecified stage: Secondary | ICD-10-CM | POA: Diagnosis present

## 2014-11-30 LAB — RETICULOCYTES
RBC.: 2.94 MIL/uL — AB (ref 3.87–5.11)
RETIC CT PCT: 1.7 % (ref 0.4–3.1)
Retic Count, Absolute: 50 10*3/uL (ref 19.0–186.0)

## 2014-11-30 LAB — URINALYSIS, ROUTINE W REFLEX MICROSCOPIC
BILIRUBIN URINE: NEGATIVE
Glucose, UA: NEGATIVE mg/dL
HGB URINE DIPSTICK: NEGATIVE
Ketones, ur: NEGATIVE mg/dL
Leukocytes, UA: NEGATIVE
NITRITE: NEGATIVE
PH: 5 (ref 5.0–8.0)
Protein, ur: NEGATIVE mg/dL
SPECIFIC GRAVITY, URINE: 1.012 (ref 1.005–1.030)
Urobilinogen, UA: 0.2 mg/dL (ref 0.0–1.0)

## 2014-11-30 LAB — COMPREHENSIVE METABOLIC PANEL
ALK PHOS: 47 U/L (ref 38–126)
ALT: 13 U/L — ABNORMAL LOW (ref 14–54)
AST: 18 U/L (ref 15–41)
Albumin: 2.8 g/dL — ABNORMAL LOW (ref 3.5–5.0)
Anion gap: 20 — ABNORMAL HIGH (ref 5–15)
BUN: 132 mg/dL — AB (ref 6–20)
CO2: 25 mmol/L (ref 22–32)
CREATININE: 7.34 mg/dL — AB (ref 0.44–1.00)
Calcium: 7.5 mg/dL — ABNORMAL LOW (ref 8.9–10.3)
Chloride: 91 mmol/L — ABNORMAL LOW (ref 101–111)
GFR calc non Af Amer: 4 mL/min — ABNORMAL LOW (ref >60)
GFR, EST AFRICAN AMERICAN: 5 mL/min — AB (ref >60)
GLUCOSE: 154 mg/dL — AB (ref 65–99)
Potassium: 4.5 mmol/L (ref 3.5–5.1)
SODIUM: 136 mmol/L (ref 135–145)
Total Bilirubin: 0.5 mg/dL (ref 0.3–1.2)
Total Protein: 6.6 g/dL (ref 6.5–8.1)

## 2014-11-30 LAB — CBC WITH DIFFERENTIAL/PLATELET
BASOS PCT: 0 %
Basophils Absolute: 0 10*3/uL (ref 0.0–0.1)
EOS ABS: 0.1 10*3/uL (ref 0.0–0.7)
EOS PCT: 1 %
HCT: 25.2 % — ABNORMAL LOW (ref 36.0–46.0)
Hemoglobin: 8.1 g/dL — ABNORMAL LOW (ref 12.0–15.0)
Lymphocytes Relative: 33 %
Lymphs Abs: 4.6 10*3/uL — ABNORMAL HIGH (ref 0.7–4.0)
MCH: 26.2 pg (ref 26.0–34.0)
MCHC: 32.1 g/dL (ref 30.0–36.0)
MCV: 81.6 fL (ref 78.0–100.0)
MONO ABS: 0.7 10*3/uL (ref 0.1–1.0)
Monocytes Relative: 5 %
NEUTROS ABS: 8.4 10*3/uL — AB (ref 1.7–7.7)
Neutrophils Relative %: 61 %
PLATELETS: 40 10*3/uL — AB (ref 150–400)
RBC: 3.09 MIL/uL — ABNORMAL LOW (ref 3.87–5.11)
RDW: 15.8 % — ABNORMAL HIGH (ref 11.5–15.5)
WBC: 13.8 10*3/uL — ABNORMAL HIGH (ref 4.0–10.5)

## 2014-11-30 LAB — I-STAT TROPONIN, ED: TROPONIN I, POC: 0.04 ng/mL (ref 0.00–0.08)

## 2014-11-30 LAB — BRAIN NATRIURETIC PEPTIDE: B NATRIURETIC PEPTIDE 5: 174.6 pg/mL — AB (ref 0.0–100.0)

## 2014-11-30 MED ORDER — ONDANSETRON HCL 4 MG/2ML IJ SOLN: 4.0000 mg | Freq: Four times a day (QID) | INTRAMUSCULAR | Status: DC | PRN

## 2014-11-30 MED ORDER — SODIUM CHLORIDE 0.9 % IV BOLUS (SEPSIS)
1000.0000 mL | Freq: Once | INTRAVENOUS | Status: AC
  Administered 2014-11-30: 1000 mL via INTRAVENOUS

## 2014-11-30 MED ORDER — PRAVASTATIN SODIUM 10 MG PO TABS
10.0000 mg | ORAL_TABLET | Freq: Every day | ORAL | Status: DC
  Administered 2014-11-30 – 2014-12-03 (×4): 10 mg via ORAL
  Filled 2014-11-30 (×6): qty 1

## 2014-11-30 MED ORDER — AMLODIPINE BESYLATE 5 MG PO TABS
5.0000 mg | ORAL_TABLET | Freq: Every day | ORAL | Status: DC
  Administered 2014-12-01 – 2014-12-03 (×3): 5 mg via ORAL
  Filled 2014-11-30 (×5): qty 1

## 2014-11-30 MED ORDER — SODIUM CHLORIDE 0.9 % IV BOLUS (SEPSIS)
500.0000 mL | Freq: Once | INTRAVENOUS | Status: AC
  Administered 2014-11-30: 500 mL via INTRAVENOUS

## 2014-11-30 MED ORDER — FERROUS SULFATE 325 (65 FE) MG PO TABS
325.0000 mg | ORAL_TABLET | Freq: Every day | ORAL | Status: DC
  Administered 2014-12-01: 325 mg via ORAL
  Filled 2014-11-30: qty 1

## 2014-11-30 MED ORDER — SODIUM CHLORIDE 0.9 % IJ SOLN
3.0000 mL | Freq: Two times a day (BID) | INTRAMUSCULAR | Status: DC
  Administered 2014-12-01 – 2014-12-04 (×6): 3 mL via INTRAVENOUS

## 2014-11-30 MED ORDER — ONDANSETRON HCL 4 MG PO TABS: 4.0000 mg | ORAL_TABLET | Freq: Four times a day (QID) | ORAL | Status: DC | PRN

## 2014-11-30 MED ORDER — PROSIGHT PO TABS
1.0000 | ORAL_TABLET | Freq: Every day | ORAL | Status: DC
  Administered 2014-12-01 – 2014-12-03 (×3): 1 via ORAL
  Filled 2014-11-30 (×5): qty 1

## 2014-11-30 MED ORDER — PRESERVISION AREDS 2 PO CAPS: ORAL_CAPSULE | Freq: Every day | ORAL | Status: DC

## 2014-11-30 NOTE — ED Notes (Signed)
UNABLE TO COLLECT LABS md IS IN ROOM WITH PATIENT

## 2014-11-30 NOTE — H&P (Signed)
History and Physical  Patient Name: Eileen Cruz     WUJ:[id number]    DOB: 08/15/1915    DOA: 2014-12-07 Referring physician: Frederick Peers, MD PCP: Kaleen Mask, MD      Chief Complaint: Leg weakness and twitching  HPI: Eileen Cruz is a 81 y.o. female with a past medical history significant for PVD with AAA s/p repair in 2008, L renal artery stenosis, and carotid disease, ASCVD s/p PCI to RCA in 2006, recurrent tophaceous gout, chronic diastolic CHF, HTN, CKD III and chronic respiratory failure from restrictive lung disease who presents with leg weakness, dyspnea, and AKI.  Most of the patient's history is collected from her son who is present at the bedside.  He describes a period of about 1-2 weeks of progressively worsening generalized weakness, bruising, dyspnea, increased oxygen demands and increased swelling.  The patient was admitted to [facility] in January of this year with cellulitis of the left lower extremity. During that hospitalization she was noted to be oxygen requiring, as a result of her chronic restrictive lung disease (from kyphosis and obesity). She was discharged after that hospitalization to a nursing home for 3 weeks after which she returned home where she lives independently with assistance with groceries and household chores from her son.  Since then the patient has been recovering well at home. The son notes that she has had varying degrees of gouty arthritis this summer for which her PCP increased her allopurinol to 100 mg twice daily in the last few weeks.  He had also noted her to be anemic, for which he had ordered iron supplements, but per the son, this anemia was worsening.  Lastly, she had had worsening swelling over the last few weeks for which her PCP had increased her Lasix to 40 mg in the morning and 20 mg in the afternoon.  He denies that she has been taking NSAIDs. She has had no other new medicines.  In the ED, the patient was afebrile and  hemodynamically stable. She was slightly hypoxic. The sodium, potassium, bicarbonate were all normal. The BUN was 132 mg/dL and the serum creatinine is 7.34 mg/dL from a baseline around 1.2 mg/dL and so the patient was admitted for acute renal failure.     Review of Systems:  Pt complains of leg weakness, twitching, dyspnea, left back pain, legs weeping, increased redness of the legs, increased bruising recently, chest discomfort, increased oxygen demand, increased pulse oximeter, increased swelling. Pt denies any fever, chills, syncope, sputum, dysuria, foul urine, abdominal pain, leg pain.  All other systems negative except as just noted or noted in the history of present illness.   Allergies:  Beta blockers intolerance   Home medications: 1. Amlodipine 5 mg daily 2. Furosemide 40 mg in the morning 20 mg at night 3. Lovastatin 20 mg nightly 4. Aspirin 325 mg daily 5. Allopurinol 100 mg twice daily 6. Iron 325 mg once daily  Past medical history: 1. ASCVD  Stent to RCA in 2006 2. Chronic respiratory failure on 2 L home oxygen  No significant obstructive component  Due to kyphosis and OHS 3. HTN 4. Diastolic CHF chronic 5. CKD stage III, baseline creatinine 1.2 mg/dL 6. PVD  s/p AAA repair in 2008  Renal artery stenosis, left, no bypass due to atrophic kidney  Carotid disease, noncritical  Past surgical history: 1. Hysterectomy 2. Appendectomy 2. Hernia repair 4. AAA repair  Family history:  Father, lung cancer Brother, and a artery disease.  Social History:  Patient lives by herself. She does not drive. She is independent with all ADLs. She is dependent for all IADLs. She walks with a walker. She does not walk far.  She is a very remote former smoker and does not drink alcohol.      Physical Exam: BP 154/64 mmHg  Pulse 81  Temp(Src) 97.6 F (36.4 C) (Oral)  Resp 20  SpO2 95% General appearance: Elderly female adult female, alert but lying flat with eyes  closed.  Eyes: Anicteric, conjunctiva pink, lids and lashes normal.     ENT: No nasal deformity, discharge, or epistaxis.  OP moist without lesions.   Skin: Warm and dry.  Numerous bruises and ecchymoses.  On the right inner ankle, there is a venous stasis ulcer and chronic venous stasis change.  There is redness but no significant swelling or pain around this healing ulcer.  Discoloration of skin on left third hand digit. Cardiac: RRR, nl S1-S2.  Mild LE 1+ to shins edema Respiratory: On oxygen.  Normal respiratory effort.  CTAB without rales or wheezes.  Diminshed some on left base. Abdomen: Abdomen soft without rigidity.  No TTP.  MSK: Tophi and swelling of left 3rd digit.  Bunion deformity marked of both feet. Neuro: Oriented to person, place and time.  Answering questions appropriately but uncomfortable.  Attention normal.  Speech is fluent.  Clonic tremor with arms outstretched.  Slightly increased muscle tone.    Cranial nerves grossly normal. Psych: Behavior appropriate but ill-appearing.  Affect normal.  No evidence of aural or visual hallucinations or delusions.       Labs on Admission:  The metabolic panel shows normal sodium, potassium, bicarbonate. The serum creatinine is 7.3 mg/dL from a baseline of 1.2 mg/dL. The BUN is 1 32 mg/dL. Transaminases and bilirubin are normal. The albumin is 2.8 g/dL.  The BNP is 174 pg per mL. An initial troponin is negative. Urinalysis is bland. The complete blood count shows anemia to 8.1 g/dL from previous greater than 11. There is thrombocytopenia with platelets 40K/uL.   There is mild leukocytosis to 13.8K/uL.   Radiological Exams on Admission: Personally reviewed: Dg Chest 2 View 12/01/2014  Small L pleural effusion without evidence for airspace disease.    Dg Finger Middle Left 12/01/2014  IMPRESSION: Advanced degenerative changes in the IP joints, most pronounced in the DIP joint with deformity and diffuse soft tissue swelling. No  acute bony abnormality.    EKG: Independently reviewed. Sinus rhythm with lateral TWI no ST changes.    Assessment/Plan 1. Acute on chronic renal failure:  This is new.  The differential at this time remains broad.   -Renal ultrasound to rule out obstruction -Fluid challenge and repeat BMP -Urine electrolytes -Urinalysis shows no WBCs, but we'll check urine eosinophils -SPEP  2. Anemia and thrombocytopenia: This is also unclear in etiology. It is normocytic and worse since January. Will evaluate marrow activity, iron and vitamin status, hemolysis, and TSH, and also will attempt to review records from the patient's PCP who has been working her up for anemia as an outpatient. -Ferritin, iron and TIBC -Vitamin B-12 and folate -Haptoglobin and LDH -TSH -INR -Reticulocytes -Obtain records from Dr. Jeannetta Nap [facility] -Continue iron  3. Chronic respiratory failure:  This is from underlying restrictive lung disease. Her worsening respiratory status is presumed to be from fluid overload from #1 above. -Continue supplemental oxygen  4. Chronic diastolic CHF:  The patient is fluid overloaded, likely  do to #1 above. -Hold furosemide for now  5. HTN, PVD and ASCVD:  No current activity. -Continue statin and amlodipine -Hold clopidogrel and aspirin in setting of thrombocytopenia -Hold furosemide given AKI, unless fluid overload becomes problematic  6. Gout: -Hold allopurinol insetting of pancytopenia      DVT PPx: SCDs Diet: Cardiac Consultants: Nephrology Code Status: DNR Family Communication: Son at bedside. The patient's diagnosis of anemia, thrombocytopenia, and renal failure were discussed with the son present at the bedside. CODE STATUS was confirmed. All questions were answered. Medical decision making: What exists of the patient's previous chart was reviewed in depth and the case was discussed with Dr. Lowell GuitarPowell from nephrology and Dr.  Clarene DukeLittle. Patient seen 8:11 PM on 11/23/2014.  Disposition Plan:  Admit for fluid challenge and workup of renal failure. Consultation by nephrology. Anticipate hospitalization for 4-5 days.      Alberteen SamChristopher P Cordney Barstow [facility] Pager [phone]

## 2014-11-30 NOTE — ED Provider Notes (Signed)
CSN: [id number]     Arrival date & time 12/10/2014  1501 History   First MD Initiated Contact with Patient 12/20/2014 1531     Chief Complaint  Patient presents with  . Shortness of Breath     (Consider location/radiation/quality/duration/timing/severity/associated sxs/prior Treatment) HPI Comments: 81 year old female with multiple medical problems including CAD, hypertension, CHF, AAA status post repair, hypoxemia on 3 L home oxygen who presents with multiple complaints including shortness of breath and generalized weakness. History obtained with the assistance of the patient's son. He states that the patient has had progressively worsening generalized weakness especially over the past one week. He notices it particularly when she tries to stand up and walk with her walker. Her legs appear to be weak and she is shaky. She intermittently becomes anxious and calls out for someone to sit with her which she says is due to her shortness of breath but son states her O2 sat does not change during these episodes. He suspects that they're related to anxiety. She has had worsening symptoms at night and has been sleeping during the day. She denies any chest pain or any other pain. She has chronic lower extremity edema but it is slightly improved today. She has swelling of bilateral middle fingers likely related to gout but 4 days ago began having some skin changes on her left middle finger after striking it and sustaining a bruise.  Patient is a 81 y.o. female presenting with shortness of breath. The history is provided by the patient and a caregiver.  Shortness of Breath   Past Medical History  Diagnosis Date  . Coronary artery disease     No major obstruction in the left system.  100 RCA occlusion treated with a Taxus stent July 2006  . Hypertension   . Renal insufficiency   . CHF (congestive heart failure) (HCC)   . Stenosis of aortic valve   . Abdominal aortic aneurysm (HCC)   . Renal artery stenosis  (HCC)   . Glaucoma   . Cataract   . Rhinitis   . Hypoxemia    Past Surgical History  Procedure Laterality Date  . Angioplasty      with stent  . Total abdominal hysterectomy    . Tubal ligation    . Appendectomy    . Cataract surgery    . Hernia repair    . Tumor removal    . Abdominal aortic aneurysm repair     Family History  Problem Relation Age of Onset  . Cancer Father   . Coronary artery disease Brother 64   Social History  Substance Use Topics  . Smoking status: Former Smoker -- 0.50 packs/day for 15 years    Types: Cigarettes    Quit date: 06/10/1980  . Smokeless tobacco: None  . Alcohol Use: No   OB History    No data available     Review of Systems  Respiratory: Positive for shortness of breath.    10 Systems reviewed and are negative for acute change except as noted in the HPI.    Allergies  Beta adrenergic blockers  Home Medications   Prior to Admission medications   Medication Sig Start Date End Date Taking? Authorizing Provider  acetaminophen (TYLENOL) 325 MG tablet Take 2 tablets (650 mg total) by mouth every 6 (six) hours as needed for mild pain. 02/04/14  Yes Marianne L York, PA-C  albuterol (PROVENTIL HFA;VENTOLIN HFA) 108 (90 BASE) MCG/ACT inhaler Inhale 2 puffs into the lungs every  4 (four) hours as needed for wheezing or shortness of breath.    Yes Historical Provider, MD  allopurinol (ZYLOPRIM) 100 MG tablet Take 100 mg by mouth daily. 09/20/14  Yes Historical Provider, MD  amLODipine (NORVASC) 5 MG tablet Take 5 mg by mouth daily.   Yes Historical Provider, MD  aspirin 325 MG tablet Take 325 mg by mouth daily.   Yes Historical Provider, MD  clopidogrel (PLAVIX) 75 MG tablet TAKE 1 TABLET BY MOUTH ONCE DAILY 07/07/14  Yes Rollene RotundaJames Hochrein, MD  ferrous sulfate 325 (65 FE) MG tablet Take 325 mg by mouth daily with breakfast.   Yes Historical Provider, MD  fluticasone (FLONASE) 50 MCG/ACT nasal spray Place 2 sprays into both nostrils  daily. Patient taking differently: Place 2 sprays into both nostrils daily as needed for allergies.  02/04/14  Yes Marianne L York, PA-C  furosemide (LASIX) 40 MG tablet Take 40 mg by mouth daily. 01/04/14  Yes Historical Provider, MD  lovastatin (MEVACOR) 20 MG tablet Take 20 mg by mouth at bedtime.   Yes Historical Provider, MD  Multiple Vitamins-Minerals (PRESERVISION AREDS 2 PO) Take 1 capsule by mouth daily.   Yes Historical Provider, MD  sodium chloride (OCEAN) 0.65 % SOLN nasal spray Place 1 spray into both nostrils as needed for congestion.   Yes Historical Provider, MD  traMADol (ULTRAM) 50 MG tablet Take 50 mg by mouth every 6 (six) hours as needed for moderate pain or severe pain.  11/25/14  Yes Historical Provider, MD  antiseptic oral rinse (CPC / CETYLPYRIDINIUM CHLORIDE 0.05%) 0.05 % LIQD solution 7 mLs by Mouth Rinse route 2 (two) times daily. Patient not taking: Reported on 12/09/2014 02/04/14   Stephani PoliceMarianne L York, PA-C  loratadine (CLARITIN) 10 MG tablet Take 1 tablet (10 mg total) by mouth daily. Patient not taking: Reported on 12/06/2014 02/04/14   Tora KindredMarianne L York, PA-C   BP 148/43 mmHg  Pulse 85  Temp(Src) 97.6 F (36.4 C) (Oral)  Resp 16  SpO2 94% Physical Exam  Constitutional: She is oriented to person, place, and time. She appears well-developed and well-nourished. No distress.  Elderly, frail-appearing woman  HENT:  Head: Normocephalic and atraumatic.  Mouth/Throat: Oropharynx is clear and moist.  Moist mucous membranes  Eyes: Conjunctivae are normal. Pupils are equal, round, and reactive to light.  Neck: Neck supple.  Cardiovascular: Normal rate, regular rhythm and normal heart sounds.   3/6 systolic murmur  Pulmonary/Chest: Effort normal.  Diminished breath sounds bilaterally L>R  Abdominal: Soft. Bowel sounds are normal. She exhibits no distension. There is no tenderness.  Musculoskeletal:  2+ pitting edema b/l LE  Neurological: She is alert and oriented to person,  place, and time.  Fluent speech  Skin: Skin is warm.  Chronic ulceration anterior L lower leg; ecchymosis distal L middle finger; uniform swelling worst over PIP joints of b/l middle fingers, non-tender  Psychiatric: She has a normal mood and affect. Judgment normal.  Nursing note and vitals reviewed.   ED Course  Procedures (including critical care time) Labs Review Labs Reviewed  BRAIN NATRIURETIC PEPTIDE - Abnormal; Notable for the following:    B Natriuretic Peptide 174.6 (*)    All other components within normal limits  COMPREHENSIVE METABOLIC PANEL - Abnormal; Notable for the following:    Chloride 91 (*)    Glucose, Bld 154 (*)    BUN 132 (*)    Creatinine, Ser 7.34 (*)    Calcium 7.5 (*)    Albumin 2.8 (*)  ALT 13 (*)    GFR calc non Af Amer 4 (*)    GFR calc Af Amer 5 (*)    Anion gap 20 (*)    All other components within normal limits  CBC WITH DIFFERENTIAL/PLATELET - Abnormal; Notable for the following:    WBC 13.8 (*)    RBC 3.09 (*)    Hemoglobin 8.1 (*)    HCT 25.2 (*)    RDW 15.8 (*)    Platelets 40 (*)    Neutro Abs 8.4 (*)    Lymphs Abs 4.6 (*)    All other components within normal limits  BASIC METABOLIC PANEL - Abnormal; Notable for the following:    Chloride 95 (*)    BUN 130 (*)    Creatinine, Ser 7.08 (*)    Calcium 7.5 (*)    GFR calc non Af Amer 4 (*)    GFR calc Af Amer 5 (*)    Anion gap 19 (*)    All other components within normal limits  CBC - Abnormal; Notable for the following:    WBC 12.1 (*)    RBC 2.87 (*)    Hemoglobin 7.6 (*)    HCT 23.7 (*)    RDW 15.9 (*)    Platelets 40 (*)    All other components within normal limits  RETICULOCYTES - Abnormal; Notable for the following:    RBC. 2.94 (*)    All other components within normal limits  IRON AND TIBC - Abnormal; Notable for the following:    Iron 25 (*)    Saturation Ratios 8 (*)    All other components within normal limits  URINALYSIS, ROUTINE W REFLEX MICROSCOPIC (NOT  AT [facility])  PROTIME-INR  TSH  LACTATE DEHYDROGENASE  FERRITIN  VITAMIN B12  FOLATE  OCCULT BLOOD X 1 CARD TO LAB, STOOL  HAPTOGLOBIN  SODIUM, URINE, RANDOM  UREA NITROGEN, URINE  CREATININE, URINE, RANDOM  PROTEIN ELECTROPHORESIS, SERUM  I-STAT TROPOININ, ED  CYTOLOGY - NON PAP    Imaging Review  I have personally reviewed and evaluated these images and lab results as part of my medical decision-making.   EKG Interpretation   Date/Time:  Tuesday December 24, 2014 15:17:30 EST Ventricular Rate:  88 PR Interval:  167 QRS Duration: 109 QT Interval:  361 QTC Calculation: 437 R Axis:   -5 Text Interpretation:  Sinus rhythm Borderline repolarization abnormality T  wave inversions in V5-V6 new from previous EKG 01/31/14 Confirmed by Carnell Beavers  MD, Marjorie Lussier [phone]) on 2014/12/24 3:33:36 PM      MDM   Final diagnoses:  Acute renal failure, unspecified acute renal failure type (HCC)   81yo F brought in by son for worsening generalized weakness and shakiness and possible increased WOB recently. Pt breathing comfortably, awake and alert at presentation. VS stable. Diminished BS but no crackles on exam. Obtained basic labs listed above including BNP and ordered CXR and L finger film given degree of swelling and ecchymoses.   Labs notable for WBC 13.8, Hgb 8.1, the remainder of diff still pending. Cr 7.34 BUN 132, Cl 91, Alb 2.8, BNP 174. Trop 0.04. CXR w/ small pleural effusion but otherwise stable. Finger film w/ degenerative changes, negative acute. Gave the patient 1.5L IVF bolus given reassuring BNP. She does not appear to have urinary obstruction and has not had significant volume losses recently. Discussed w/ hospitalist and patient admitted for further w/u of acute renal failure.    Laurence Spates, MD 12/01/14 [phone]

## 2014-11-30 NOTE — ED Notes (Signed)
Bed: WA17 Expected date:  Expected time:  Means of arrival:  Comments: 81 yo F SOB

## 2014-11-30 NOTE — Progress Notes (Signed)
EDCM spoke to patient and her son Eileen Cruz [phone] at bedside.  John reports the patient was living in an independent living facility, but since her condition has worsened, patient is now [place] with [place].  Patient has had [facility] in the past for home health services.  Patient reports [facility] and would like [facility] again for home health services.  Patient has a walker, transport chair and a bedside commode at home.  Patient [facility].  Patient's son confirms patient's pcp is Dr. Windle GuardWilson Elkins in [place].  EDCM provided patient's son with list of home health agencies in [place] [facility].  EDCM explained with home health services patient may receive a RN, PT, OT aide and social worker if needed.  EDCM also provided patient's son with list of private duty nursing agencies and explained that it would be an out of pocket expense.  Patients son seemed interested in private duty nursing agencies as he is also taking care of his wife at home.  Patient and patient's son thankful for services.  No further EDCM needs at this time.

## 2014-11-30 NOTE — ED Notes (Signed)
Patient comes from home today with complaint of shortness of breath. Patient wears 3 liters at home and was 89% when EMS arrived. Patient was placed on a non-rebreather for  Transport. She is now at 99%. Patient has no complaints of pain or anything else.

## 2014-12-01 DIAGNOSIS — N179 Acute kidney failure, unspecified: Secondary | ICD-10-CM

## 2014-12-01 DIAGNOSIS — I2510 Atherosclerotic heart disease of native coronary artery without angina pectoris: Secondary | ICD-10-CM

## 2014-12-01 DIAGNOSIS — D649 Anemia, unspecified: Secondary | ICD-10-CM

## 2014-12-01 DIAGNOSIS — D696 Thrombocytopenia, unspecified: Secondary | ICD-10-CM | POA: Diagnosis present

## 2014-12-01 DIAGNOSIS — J9611 Chronic respiratory failure with hypoxia: Secondary | ICD-10-CM

## 2014-12-01 DIAGNOSIS — I10 Essential (primary) hypertension: Secondary | ICD-10-CM

## 2014-12-01 DIAGNOSIS — L8990 Pressure ulcer of unspecified site, unspecified stage: Secondary | ICD-10-CM

## 2014-12-01 LAB — CBC
HEMATOCRIT: 23.7 % — AB (ref 36.0–46.0)
HEMOGLOBIN: 7.6 g/dL — AB (ref 12.0–15.0)
MCH: 26.5 pg (ref 26.0–34.0)
MCHC: 32.1 g/dL (ref 30.0–36.0)
MCV: 82.6 fL (ref 78.0–100.0)
Platelets: 40 10*3/uL — ABNORMAL LOW (ref 150–400)
RBC: 2.87 MIL/uL — AB (ref 3.87–5.11)
RDW: 15.9 % — ABNORMAL HIGH (ref 11.5–15.5)
WBC: 12.1 10*3/uL — AB (ref 4.0–10.5)

## 2014-12-01 LAB — IRON AND TIBC
Iron: 25 ug/dL — ABNORMAL LOW (ref 28–170)
SATURATION RATIOS: 8 % — AB (ref 10.4–31.8)
TIBC: 307 ug/dL (ref 250–450)
UIBC: 282 ug/dL

## 2014-12-01 LAB — FOLATE: Folate: 29 ng/mL (ref >5.9)

## 2014-12-01 LAB — PROTIME-INR
INR: 1.12 (ref 0.00–1.49)
Prothrombin Time: 14.6 seconds (ref 11.6–15.2)

## 2014-12-01 LAB — LACTATE DEHYDROGENASE: LDH: 190 U/L (ref 98–192)

## 2014-12-01 LAB — BASIC METABOLIC PANEL
ANION GAP: 19 — AB (ref 5–15)
BUN: 130 mg/dL — ABNORMAL HIGH (ref 6–20)
CHLORIDE: 95 mmol/L — AB (ref 101–111)
CO2: 24 mmol/L (ref 22–32)
CREATININE: 7.08 mg/dL — AB (ref 0.44–1.00)
Calcium: 7.5 mg/dL — ABNORMAL LOW (ref 8.9–10.3)
GFR calc non Af Amer: 4 mL/min — ABNORMAL LOW (ref >60)
GFR, EST AFRICAN AMERICAN: 5 mL/min — AB (ref >60)
Glucose, Bld: 85 mg/dL (ref 65–99)
POTASSIUM: 4.4 mmol/L (ref 3.5–5.1)
Sodium: 138 mmol/L (ref 135–145)

## 2014-12-01 LAB — SODIUM, URINE, RANDOM: Sodium, Ur: 73 mmol/L

## 2014-12-01 LAB — CREATININE, URINE, RANDOM: CREATININE, URINE: 42.52 mg/dL

## 2014-12-01 LAB — TSH: TSH: 2.628 u[IU]/mL (ref 0.350–4.500)

## 2014-12-01 LAB — VITAMIN B12: Vitamin B-12: 354 pg/mL (ref 180–914)

## 2014-12-01 LAB — FERRITIN: FERRITIN: 129 ng/mL (ref 11–307)

## 2014-12-01 MED ORDER — SODIUM CHLORIDE 0.9 % IV SOLN
510.0000 mg | INTRAVENOUS | Status: AC
  Administered 2014-12-01 – 2014-12-04 (×2): 510 mg via INTRAVENOUS
  Filled 2014-12-01 (×2): qty 17

## 2014-12-01 MED ORDER — ACETAMINOPHEN 325 MG PO TABS
650.0000 mg | ORAL_TABLET | Freq: Four times a day (QID) | ORAL | Status: DC | PRN
  Administered 2014-12-01 – 2014-12-03 (×3): 650 mg via ORAL
  Filled 2014-12-01 (×3): qty 2

## 2014-12-01 MED ORDER — CETYLPYRIDINIUM CHLORIDE 0.05 % MT LIQD
7.0000 mL | Freq: Two times a day (BID) | OROMUCOSAL | Status: DC
  Administered 2014-12-01 – 2014-12-04 (×7): 7 mL via OROMUCOSAL

## 2014-12-01 NOTE — Progress Notes (Signed)
[facility] PROGRESS NOTE  Eileen Cruz ZOX:[id number] DOB: 11/06/1915 DOA: 12/20/2014 PCP: Kaleen MaskELKINS,WILSON OLIVER, MD   Assessment/Plan:  Acute renal failure -presented to ED on 11/8 with BUN 132 Creatinine 7.34, GFR 5, with progressive weakness, increased swelling, dyspnea and incresed oxygen demands. -Renal ultrasound negative for hydronephrosis with mild left renal cortical thinning and increased echogenicity. -Fluid challenge and repeat BMP -Urine electrolytes and urine eosinophiles -SPEP -Strict I/O -consult nephrology  Chronic Anemia and Thrombocytopenia -worsening since January, PCP following -Iron 25, TIBC 307, UIBC 282 -B12 354, folate 29.0 -TSH-2.628 -INR 1.12 -Reticulocytes-1.7/50 -follow CBC, Hbg 7.6, HCT 23.7 -Continue iron  HTN, PVD, and ASCVD -stable -continue on statin and amlodipine -hold clopidogrel and aspirin in setting of thrombocytopenia -hold lasix in setting of AKI  Chronic respiratory failure - continue supplemental oxygen  Gout -hold allopurinol in setting of pancytopenia  Chronic diastolic HF - hold lasix for now  Code Status: DNR Family Communication: none at bedside Disposition Plan: Inpatient   Consultants:  Nephrology  Procedures:  none  Antibiotics: None  HPI/Subjective: Eileen Cruz is a 81 y. o female with history of chronic respiratory failure, PVD with AAA s/p repair (2008) , left renal artery stenosis , carotid artery disease, ASCVD s/p PCI to RCA (2006) recurrent tophaceous gout, chronic diastolic CHF, hypertension and CKD III who presented to the ED on 12/05/2014 with leg weakness, increased dyspnea,and AKI. In the ED the patient was afebrile and hemodynamically stable.   Objective: Filed Vitals:   12/19/2014 2106  BP: 141/53  Pulse: 88  Temp: 97.9 F (36.6 C)  Resp: 20    Intake/Output Summary (Last 24 hours) at 12/01/14 1042 Last data filed at 12/01/14 1014  Gross per 24 hour  Intake    120 ml  Output       2 ml  Net    118 ml   Filed Weights   12/22/2014 2106  Weight: 77.8 kg (171 lb 8.3 oz)    Exam:   General:  Well nourished, well developed in no acute distress  Cardiovascular: regular rate and rhythm to anterior ascultation  Respiratory: Normal respiratory effort, no wheezes, rales or rhonchi.  Abdomen: Soft non-tender , non distended.  Musculoskeletal: Tophi and swelling of left third digit. No edema  Skin: warm , dry , intact  Psych: Normal affect and mood.  Data Reviewed: Basic Metabolic Panel:  Recent Labs Lab 12/02/2014 1610 12/01/14 0555  NA 136 138  K 4.5 4.4  CL 91* 95*  CO2 25 24  GLUCOSE 154* 85  BUN 132* 130*  CREATININE 7.34* 7.08*  CALCIUM 7.5* 7.5*   Liver Function Tests:  Recent Labs Lab 12/06/2014 1610  AST 18  ALT 13*  ALKPHOS 47  BILITOT 0.5  PROT 6.6  ALBUMIN 2.8*   No results for input(s): LIPASE, AMYLASE in the last 168 hours. No results for input(s): AMMONIA in the last 168 hours. CBC:  Recent Labs Lab 11/29/2014 1610 12/01/14 0555  WBC 13.8* 12.1*  NEUTROABS 8.4*  --   HGB 8.1* 7.6*  HCT 25.2* 23.7*  MCV 81.6 82.6  PLT 40* 40*   Cardiac Enzymes: No results for input(s): CKTOTAL, CKMB, CKMBINDEX, TROPONINI in the last 168 hours. BNP (last 3 results)  Recent Labs  01/31/14 1714 12/01/2014 1610  BNP 109.8* 174.6*    ProBNP (last 3 results) No results for input(s): PROBNP in the last 8760 hours.  CBG: No results for input(s): GLUCAP in the last 168 hours.  No results found for this or any previous visit (from the past 240 hour(s)).   Studies: Dg Chest 2 View  Dec 02, 2014  CLINICAL DATA:  Shortness of breath and hypoxia beginning today. EXAM: CHEST  2 VIEW COMPARISON:  11/18/2014 and 01/31/2014 FINDINGS: Lungs are adequately inflated with new small left pleural effusion likely with associated atelectasis. Borderline stable cardiomegaly. Findings suggesting a moderate size hiatal hernia unchanged. There is calcified  plaque over the thoracoabdominal aorta. Calcified granuloma unchanged over the anterior midlung on the lateral film. Remainder of the exam is unchanged. IMPRESSION: New small left pleural effusion likely with associated atelectasis. Mild stable cardiomegaly. Stable moderate size hiatal hernia. Electronically Signed   By: Elberta Fortis M.D.   On: 12/02/2014 16:43   US Renal  Dec 02, 2014  CLINICAL DATA:  Acute renal insufficiency EXAM: RENAL / URINARY TRACT ULTRASOUND COMPLETE COMPARISON:  CT scan 12/20/2005 FINDINGS: Right Kidney: Length: 11.0 cm. Normal renal cortical thickness. Diffuse increased echogenicity suggesting medical renal disease. No hydronephrosis. Left Kidney: Length: 8.8 cm. Mild renal cortical thinning and slight increased echogenicity. No hydronephrosis. Bladder: Mild bladder distention.  Ureteral jets not documented. IMPRESSION: 1. Increased echogenicity of the kidneys and mild left renal cortical thinning but no hydronephrosis. 2. Mild bladder distention. Electronically Signed   By: Rudie Meyer M.D.   On: 12-02-14 23:46   Dg Finger Middle Left  12-02-2014  CLINICAL DATA:  Swelling and bruising on tip of finger. History of gout. EXAM: LEFT MIDDLE FINGER 2+V COMPARISON:  None. FINDINGS: Advanced degenerative changes within the IP joints of the left middle finger, most pronounced in the DIP joint. Deformity noted at the DIP joint. No fracture. Diffuse soft tissue swelling. IMPRESSION: Advanced degenerative changes in the IP joints, most pronounced in the DIP joint with deformity and diffuse soft tissue swelling. No acute bony abnormality. Electronically Signed   By: Charlett Nose M.D.   On: 12/02/2014 16:43    Scheduled Meds: . amLODipine  5 mg Oral Daily  . antiseptic oral rinse  7 mL Mouth Rinse BID  . ferrous sulfate  325 mg Oral Q breakfast  . multivitamin  1 tablet Oral Daily  . pravastatin  10 mg Oral q1800  . sodium chloride  3 mL Intravenous Q12H   Continuous Infusions:    Principal Problem:   Acute renal failure (ARF) (HCC) Active Problems:   Essential hypertension   Chronic respiratory failure (HCC)   CAD (coronary artery disease)   Peripheral vascular disease (HCC)   Anemia, unspecified   Thrombocytopenia (HCC)    Time spent: 75 minutes    Laural Benes, Bree PA-S, helped in writing parts of this note [facility]  If 7PM-7AM, please contact night-coverage at [email or web address], password [facility] 12/01/2014, 10:42 AM  LOS: 1 day    Clint Lipps Pager: [phone] 12/01/2014, 4:46 PM

## 2014-12-01 NOTE — Evaluation (Signed)
Occupational Therapy Evaluation Patient Details Name: Eileen Cruz MRN: [id number] DOB: 1915-10-25 Today's Date: 12/01/2014    History of Present Illness Pt is a 81 yo female admitted with SOB and LE weakness and twitching.  Pt with acute on chronic kidney disease and CHF.  Pt with PMH of AAA repair in '08, gout, HTN, CKDIII and PVD as well as macular degeneration.     Clinical Impression   Pt admitted with the above diagnosis and has the deficits listed below. Pt would benefit from cont OT to increase independence with basic adls and adl transfers so she can hopefully return home with her son at S level and possibly back to I living after rehab.  If symptoms persist and pt unable to gain full independence, pt may need to consider ALF long term.      Follow Up Recommendations  SNF;Supervision/Assistance - 24 hour    Equipment Recommendations  Other (comment) (TBD)    Recommendations for Other Services       Precautions / Restrictions Precautions Precautions: Fall Precaution Comments: Pt's O2 drops with activity. Please monitor. Restrictions Weight Bearing Restrictions: No      Mobility Bed Mobility Overal bed mobility: Needs Assistance Bed Mobility: Supine to Sit     Supine to sit: Mod assist     General bed mobility comments: HOB at 30 degrees.  Transfers Overall transfer level: Needs assistance Equipment used: Rolling walker (2 wheeled) Transfers: Sit to/from [employer] Sit to Stand: Min assist;+2 safety/equipment Stand pivot transfers: Min assist;+2 safety/equipment       General transfer comment: Pt shaky on her feet.  States she has not walked much lately.  Pt fatigues quickly and O2 sats dropped; limited how far she can walk.    Balance Overall balance assessment: Needs assistance Sitting-balance support: Bilateral upper extremity supported Sitting balance-Leahy Scale: Fair     Standing balance support: Bilateral upper extremity  supported;During functional activity Standing balance-Leahy Scale: Poor Standing balance comment: Pt required outside assist to remain standing.                            ADL Overall ADL's : Needs assistance/impaired Eating/Feeding: Minimal assistance;Sitting Eating/Feeding Details (indicate cue type and reason): min assist at times due to poor vision.  usually got by with VCs. Grooming: Therapist, nutritional;Set up;Sitting Grooming Details (indicate cue type and reason): with cues for vision pt groomed in sitting. Upper Body Bathing: Minimal assitance;Sitting   Lower Body Bathing: Maximal assistance;Sit to/from stand Lower Body Bathing Details (indicate cue type and reason): assist to get onto feet and to let go of walker long enough to bathe between legs in standing.  Pt struggled getting to her feet. Upper Body Dressing : Minimal assistance;Sitting   Lower Body Dressing: Maximal assistance;Sit to/from stand Lower Body Dressing Details (indicate cue type and reason): assist with socks/shoes, starting clothes over feet and standing to pull pants up. Toilet Transfer: Minimal assistance;+2 for safety/equipment;Stand-pivot;Ambulation;RW Toilet Transfer Details (indicate cue type and reason): pt with shaking in LEs when up and pt fatigues quickly with drop in O2 sats when up. Toileting- Clothing Manipulation and Hygiene: Maximal assistance;Sit to/from stand Toileting - Clothing Manipulation Details (indicate cue type and reason): pt cannot let go of walker to manage clothing in standing.     Functional mobility during ADLs: +2 for safety/equipment;Minimal assistance;Rolling walker General ADL Comments: Pt fatigues quickly and O2 sats dropping therefore session limited.  Pt  able to do basic UE adls well with cues for vision.  More assist needed for LE adls.     Vision Vision Assessment?: Vision impaired- to be further tested in functional context Additional Comments: macular  degeneration limites what she can see centrally.  Things are set up well in her I living apartment.  She struggles more out of her environment.   Perception Perception Perception Tested?: No   Praxis Praxis Praxis tested?: Within functional limits    Pertinent Vitals/Pain Pain Assessment: 0-10 Pain Score: 4  Pain Location: back and finger with gout. Pain Descriptors / Indicators: Aching Pain Intervention(s): Limited activity within patient's tolerance;Monitored during session;Repositioned     Hand Dominance Right   Extremity/Trunk Assessment Upper Extremity Assessment Upper Extremity Assessment: RUE deficits/detail RUE Deficits / Details: R shoulder ROM limited to 80 degrees of flexion.  Pt has had long term problems with this shoulder. RUE: Unable to fully assess due to pain RUE Coordination: decreased gross motor   Lower Extremity Assessment Lower Extremity Assessment: Defer to PT evaluation   Cervical / Trunk Assessment Cervical / Trunk Assessment: Kyphotic   Communication Communication Communication: HOH   Cognition Arousal/Alertness: Awake/alert Behavior During Therapy: WFL for tasks assessed/performed Overall Cognitive Status: Within Functional Limits for tasks assessed                     General Comments       Exercises       Shoulder Instructions      Home Living Family/patient expects to be discharged to:: Skilled nursing facility Living Arrangements: Children;Other (Comment) (was in I living until recently, then with son.)                               Additional Comments: Pt was very independent until recently.  Pt lived in I living and was independent with all basic adls.  Some meals provided but cooked some in microwave.  Just recently became weak and went to live with son but son unable to care for her and his wife both.      Prior Functioning/Environment Level of Independence: Needs assistance  Gait / Transfers Assistance  Needed: walked Ily with walker until recently ADL's / Homemaking Assistance Needed: Son has been assisting with IADS for quite some time but has only assisted with ADLs for a couple of weeks.  Prior to this she was I with basic adls.        OT Diagnosis: Generalized weakness;Acute pain   OT Problem List: Decreased strength;Decreased range of motion;Decreased activity tolerance;Impaired balance (sitting and/or standing);Impaired vision/perception;Decreased knowledge of use of DME or AE;Cardiopulmonary status limiting activity;Pain;Increased edema   OT Treatment/Interventions: Self-care/ADL training;DME and/or AE instruction;Therapeutic activities    OT Goals(Current goals can be found in the care plan section) Acute Rehab OT Goals Patient Stated Goal: to get independent again. OT Goal Formulation: With patient Time For Goal Achievement: 12/15/14 Potential to Achieve Goals: Good ADL Goals Pt Will Perform Eating: with set-up;sitting Pt Will Perform Lower Body Bathing: with supervision;sit to/from stand Pt Will Perform Lower Body Dressing: with supervision;sit to/from stand Additional ADL Goal #1: Pt will transfer to 3:1 over commode and toilet with S/change depends if needed w/o assist.  OT Frequency: Min 2X/week   Barriers to D/C: Decreased caregiver support  Pt lives in I living normally.  Has lived with son for short while but he is also caring for his  wife.       Co-evaluation PT/OT/SLP Co-Evaluation/Treatment: Yes Reason for Co-Treatment: For patient/therapist safety PT goals addressed during session: Mobility/safety with mobility OT goals addressed during session: ADL's and self-care      End of Session Equipment Utilized During Treatment: Rolling walker;Oxygen Nurse Communication: Mobility status;Other (comment) (decreased O2 sats with mobility)  Activity Tolerance: Patient limited by fatigue Patient left: in chair;with call bell/phone within reach;with chair alarm set    Time: 4098-11910905-0930 OT Time Calculation (min): 25 min Charges:  OT General Charges $OT Visit: 1 Procedure OT Evaluation $Initial OT Evaluation Tier I: 1 Procedure G-Codes:    Hope BuddsJones, Rylea Selway Anne 12/01/2014, 10:22 AM  [phone]

## 2014-12-01 NOTE — NC FL2 (Signed)
MEDICAID FL2 LEVEL OF CARE SCREENING TOOL     IDENTIFICATION  Patient Name: Eileen Cruz Birthdate: 12/20/1915 Sex: female Admission Date (Current Location): 12/14/2014  [facility] and [place] Number: Producer, television/film/video and Address:  [facility],  [address], [place] [address]      Provider Number: [id number]  Attending Physician Name and Address:  Clydia Llano, MD  Relative Name and Phone Number:       Current Level of Care: Hospital Recommended Level of Care: Skilled Nursing Facility Prior Approval Number:    Date Approved/Denied:   PASRR Number: [id number]  Discharge Plan: SNF    Current Diagnoses: Patient Active Problem List   Diagnosis Date Noted  . Anemia, unspecified 12/01/2014  . Thrombocytopenia (HCC) 12/01/2014  . Pressure ulcer 12/01/2014  . Acute renal failure (ARF) (HCC) 12/18/2014  . Peripheral vascular disease (HCC) 12/11/2014  . ARF (acute renal failure) (HCC) 12/17/2014  . Cellulitis 01/31/2014  . Bruit 06/11/2011  . CAD (coronary artery disease) 06/11/2011  . ALLERGIC RHINITIS 04/02/2008  . Chronic respiratory failure (HCC) 04/02/2008  . Essential hypertension 04/01/2008    Orientation ACTIVITIES/SOCIAL BLADDER RESPIRATION    Self, Time, Situation, Place  Active Continent O2 (As needed) (3l)  BEHAVIORAL SYMPTOMS/MOOD NEUROLOGICAL BOWEL NUTRITION STATUS      Continent    PHYSICIAN VISITS COMMUNICATION OF NEEDS Height & Weight Skin    Verbally  (162.6 cm) 171 lbs. Normal          AMBULATORY STATUS RESPIRATION    Assist extensive O2 (As needed) (3l)      Personal Care Assistance Level of Assistance  Bathing, Dressing Bathing Assistance: Limited assistance   Dressing Assistance: Limited assistance      Functional Limitations Info  Sight             SPECIAL CARE FACTORS FREQUENCY  PT (By licensed PT), OT (By licensed OT)     PT Frequency: 5 OT Frequency: 5           Additional  Factors Info                  Current Medications (12/01/2014): Current Facility-Administered Medications  Medication Dose Route Frequency Provider Last Rate Last Dose  . acetaminophen (TYLENOL) tablet 650 mg  650 mg Oral Q6H PRN Leda Gauze, NP   650 mg at 12/01/14 0517  . amLODipine (NORVASC) tablet 5 mg  5 mg Oral Daily Alberteen Sam, MD   5 mg at 12/01/14 0941  . antiseptic oral rinse (CPC / CETYLPYRIDINIUM CHLORIDE 0.05%) solution 7 mL  7 mL Mouth Rinse BID Alberteen Sam, MD   7 mL at 12/01/14 1000  . ferumoxytol (FERAHEME) 510 mg in sodium chloride 0.9 % 100 mL IVPB  510 mg Intravenous Q72H Zetta Bills, MD   510 mg at 12/01/14 1442  . multivitamin (PROSIGHT) tablet 1 tablet  1 tablet Oral Daily Alberteen Sam, MD   1 tablet at 12/01/14 0941  . ondansetron (ZOFRAN) tablet 4 mg  4 mg Oral Q6H PRN Alberteen Sam, MD       Or  . ondansetron (ZOFRAN) injection 4 mg  4 mg Intravenous Q6H PRN Alberteen Sam, MD      . pravastatin (PRAVACHOL) tablet 10 mg  10 mg Oral q1800 Alberteen Sam, MD   10 mg at 12/21/2014 2244  . sodium chloride 0.9 % injection 3 mL  3 mL Intravenous Q12H Cristal Deer  Lamonte SakaiP Danford, MD   3 mL at 12/01/14 1000   Do not use this list as official medication orders. Please verify with discharge summary.  Discharge Medications:   Medication List    ASK your doctor about these medications        acetaminophen 325 MG tablet  Commonly known as:  TYLENOL  Take 2 tablets (650 mg total) by mouth every 6 (six) hours as needed for mild pain.     albuterol 108 (90 BASE) MCG/ACT inhaler  Commonly known as:  PROVENTIL HFA;VENTOLIN HFA  Inhale 2 puffs into the lungs every 4 (four) hours as needed for wheezing or shortness of breath.     allopurinol 100 MG tablet  Commonly known as:  ZYLOPRIM  Take 100 mg by mouth daily.     amLODipine 5 MG tablet  Commonly known as:  NORVASC  Take 5 mg by mouth daily.     antiseptic oral  rinse 0.05 % Liqd solution  Commonly known as:  CPC / CETYLPYRIDINIUM CHLORIDE 0.05%  7 mLs by Mouth Rinse route 2 (two) times daily.     aspirin 325 MG tablet  Take 325 mg by mouth daily.     clopidogrel 75 MG tablet  Commonly known as:  PLAVIX  TAKE 1 TABLET BY MOUTH ONCE DAILY     ferrous sulfate 325 (65 FE) MG tablet  Take 325 mg by mouth daily with breakfast.     fluticasone 50 MCG/ACT nasal spray  Commonly known as:  FLONASE  Place 2 sprays into both nostrils daily.     furosemide 40 MG tablet  Commonly known as:  LASIX  Take 40 mg by mouth daily.     loratadine 10 MG tablet  Commonly known as:  CLARITIN  Take 1 tablet (10 mg total) by mouth daily.     lovastatin 20 MG tablet  Commonly known as:  MEVACOR  Take 20 mg by mouth at bedtime.     PRESERVISION AREDS 2 PO  Take 1 capsule by mouth daily.     sodium chloride 0.65 % Soln nasal spray  Commonly known as:  OCEAN  Place 1 spray into both nostrils as needed for congestion.     traMADol 50 MG tablet  Commonly known as:  ULTRAM  Take 50 mg by mouth every 6 (six) hours as needed for moderate pain or severe pain.        Relevant Imaging Results:  Relevant Lab Results:  Recent Labs    Additional Information SSN [id number]  Liliana ClineCaldwell, Ariyanah Aguado Parks, LCSW

## 2014-12-01 NOTE — NC FL2 (Deleted)
[place] MEDICAID FL2 LEVEL OF CARE SCREENING TOOL     IDENTIFICATION  Patient Name: Eileen Cruz Birthdate: January 28, 1915 Sex: female Admission Date (Current Location): 12/18/2014  [facility] and [place] Number: Producer, television/film/video and Address:  [facility],  [address], [place] [address]      Provider Number: [id number]  Attending Physician Name and Address:  Clydia Llano, MD  Relative Name and Phone Number:       Current Level of Care: Hospital Recommended Level of Care: Skilled Nursing Facility Prior Approval Number:    Date Approved/Denied:   PASRR Number:    Discharge Plan: SNF    Current Diagnoses: Patient Active Problem List   Diagnosis Date Noted  . Anemia, unspecified 12/01/2014  . Thrombocytopenia (HCC) 12/01/2014  . Pressure ulcer 12/01/2014  . Acute renal failure (ARF) (HCC) 11/24/2014  . Peripheral vascular disease (HCC) 11/27/2014  . ARF (acute renal failure) (HCC) 12/14/2014  . Cellulitis 01/31/2014  . Bruit 06/11/2011  . CAD (coronary artery disease) 06/11/2011  . ALLERGIC RHINITIS 04/02/2008  . Chronic respiratory failure (HCC) 04/02/2008  . Essential hypertension 04/01/2008    Orientation ACTIVITIES/SOCIAL BLADDER RESPIRATION    Self, Time, Situation, Place  Active Continent O2 (As needed) (3l)  BEHAVIORAL SYMPTOMS/MOOD NEUROLOGICAL BOWEL NUTRITION STATUS      Continent    PHYSICIAN VISITS COMMUNICATION OF NEEDS Height & Weight Skin    Verbally  (162.6 cm) 171 lbs. Normal          AMBULATORY STATUS RESPIRATION    Assist extensive O2 (As needed) (3l)      Personal Care Assistance Level of Assistance  Bathing, Dressing Bathing Assistance: Limited assistance   Dressing Assistance: Limited assistance      Functional Limitations Info  Sight             SPECIAL CARE FACTORS FREQUENCY  PT (By licensed PT), OT (By licensed OT)     PT Frequency: 5 OT Frequency: 5           Additional Factors Info                  Current Medications (12/01/2014): Current Facility-Administered Medications  Medication Dose Route Frequency Provider Last Rate Last Dose  . acetaminophen (TYLENOL) tablet 650 mg  650 mg Oral Q6H PRN Leda Gauze, NP   650 mg at 12/01/14 0517  . amLODipine (NORVASC) tablet 5 mg  5 mg Oral Daily Alberteen Sam, MD   5 mg at 12/01/14 0941  . antiseptic oral rinse (CPC / CETYLPYRIDINIUM CHLORIDE 0.05%) solution 7 mL  7 mL Mouth Rinse BID Alberteen Sam, MD   7 mL at 12/01/14 1000  . ferumoxytol (FERAHEME) 510 mg in sodium chloride 0.9 % 100 mL IVPB  510 mg Intravenous Q72H Zetta Bills, MD   510 mg at 12/01/14 1442  . multivitamin (PROSIGHT) tablet 1 tablet  1 tablet Oral Daily Alberteen Sam, MD   1 tablet at 12/01/14 0941  . ondansetron (ZOFRAN) tablet 4 mg  4 mg Oral Q6H PRN Alberteen Sam, MD       Or  . ondansetron (ZOFRAN) injection 4 mg  4 mg Intravenous Q6H PRN Alberteen Sam, MD      . pravastatin (PRAVACHOL) tablet 10 mg  10 mg Oral q1800 Alberteen Sam, MD   10 mg at 12/05/2014 2244  . sodium chloride 0.9 % injection 3 mL  3 mL Intravenous Q12H Cristal Deer  Lamonte SakaiP Danford, MD   3 mL at 12/01/14 1000   Do not use this list as official medication orders. Please verify with discharge summary.  Discharge Medications:   Medication List    ASK your doctor about these medications        acetaminophen 325 MG tablet  Commonly known as:  TYLENOL  Take 2 tablets (650 mg total) by mouth every 6 (six) hours as needed for mild pain.     albuterol 108 (90 BASE) MCG/ACT inhaler  Commonly known as:  PROVENTIL HFA;VENTOLIN HFA  Inhale 2 puffs into the lungs every 4 (four) hours as needed for wheezing or shortness of breath.     allopurinol 100 MG tablet  Commonly known as:  ZYLOPRIM  Take 100 mg by mouth daily.     amLODipine 5 MG tablet  Commonly known as:  NORVASC  Take 5 mg by mouth daily.     antiseptic oral rinse 0.05 % Liqd  solution  Commonly known as:  CPC / CETYLPYRIDINIUM CHLORIDE 0.05%  7 mLs by Mouth Rinse route 2 (two) times daily.     aspirin 325 MG tablet  Take 325 mg by mouth daily.     clopidogrel 75 MG tablet  Commonly known as:  PLAVIX  TAKE 1 TABLET BY MOUTH ONCE DAILY     ferrous sulfate 325 (65 FE) MG tablet  Take 325 mg by mouth daily with breakfast.     fluticasone 50 MCG/ACT nasal spray  Commonly known as:  FLONASE  Place 2 sprays into both nostrils daily.     furosemide 40 MG tablet  Commonly known as:  LASIX  Take 40 mg by mouth daily.     loratadine 10 MG tablet  Commonly known as:  CLARITIN  Take 1 tablet (10 mg total) by mouth daily.     lovastatin 20 MG tablet  Commonly known as:  MEVACOR  Take 20 mg by mouth at bedtime.     PRESERVISION AREDS 2 PO  Take 1 capsule by mouth daily.     sodium chloride 0.65 % Soln nasal spray  Commonly known as:  OCEAN  Place 1 spray into both nostrils as needed for congestion.     traMADol 50 MG tablet  Commonly known as:  ULTRAM  Take 50 mg by mouth every 6 (six) hours as needed for moderate pain or severe pain.        Relevant Imaging Results:  Relevant Lab Results:  Recent Labs    Additional Information SSN [id number]  Liliana ClineCaldwell, Shelsea Hangartner Parks, LCSW

## 2014-12-01 NOTE — Evaluation (Signed)
Physical Therapy Evaluation Patient Details Name: Eileen Cruz MRN: [id number] DOB: Oct 11, 1915 Today's Date: 12/01/2014   History of Present Illness  81 yo female admitted with acute renal failure. Hx of AAA repair, CHF, PVD,chronic restrictive lung disease, CKD, HTN, macular degeneration  Clinical Impression  On eval, pt required Min assist +2 for mobility-only able to safely perform stand pivot, bed to recliner, with RW on today. O2 sats dropped to 87% on 3L O2 with minimal activity. Recommend ST rehab at SNF.    Follow Up Recommendations SNF    Equipment Recommendations  None recommended by PT    Recommendations for Other Services OT consult     Precautions / Restrictions Precautions Precautions: Fall Precaution Comments: monitor O2 sats Restrictions Weight Bearing Restrictions: No      Mobility  Bed Mobility Overal bed mobility: Needs Assistance Bed Mobility: Supine to Sit     Supine to sit: Mod assist     General bed mobility comments: oob with OT at start of PT session  Transfers Overall transfer level: Needs assistance Equipment used: Rolling walker (2 wheeled) Transfers: Sit to/from Stand Sit to Stand: Min assist;+2 physical assistance;+2 safety/equipment Stand pivot transfers: Min assist;+2 physical assistance;+2 safety/equipment       General transfer comment: Noted jerkiness/shakiness, LE weakness. Assist to rise, stabilize, control descent. VCs safety, hand placement. Stand pivot from bed to recliner with RW. O2 sats dropped to 87% on 3L, pt fatigues very quickly/easily.   Ambulation/Gait             General Gait Details: NT-pt unable on today  Stairs            Wheelchair Mobility    Modified Rankin (Stroke Patients Only)       Balance Overall balance assessment: Needs assistance Sitting-balance support: Bilateral upper extremity supported Sitting balance-Leahy Scale: Fair     Standing balance support: Bilateral upper  extremity supported;During functional activity Standing balance-Leahy Scale: Poor Standing balance comment: Pt required outside assist to remain standing.                             Pertinent Vitals/Pain Pain Assessment: 0-10 Pain Score: 4  Pain Location: back, finger (gout) Pain Descriptors / Indicators: Sore;Aching Pain Intervention(s): Limited activity within patient's tolerance;Repositioned    Home Living Family/patient expects to be discharged to:: Skilled nursing facility Living Arrangements: Children               Additional Comments: Pt was very independent until recently.  Pt lived in I living and was independent with all basic adls.  Some meals provided but cooked some in microwave.  Just recently became weak and went to live with son but son unable to care for her and his wife both.    Prior Function Level of Independence: Needs assistance   Gait / Transfers Assistance Needed: walked short distances with walker PTA  ADL's / Homemaking Assistance Needed: Son has been assisting with IADS for quite some time but has only assisted with ADLs for a couple of weeks.  Prior to this she was I with basic adls.        Hand Dominance   Dominant Hand: Right    Extremity/Trunk Assessment   Upper Extremity Assessment: Defer to OT evaluation RUE Deficits / Details: R shoulder ROM limited to 80 degrees of flexion.  Pt has had long term problems with this shoulder. RUE: Unable to fully assess due  to pain       Lower Extremity Assessment: Generalized weakness      Cervical / Trunk Assessment: Kyphotic  Communication   Communication: HOH  Cognition Arousal/Alertness: Awake/alert Behavior During Therapy: WFL for tasks assessed/performed Overall Cognitive Status: Within Functional Limits for tasks assessed                      General Comments General comments (skin integrity, edema, etc.): Pt overall cognitively does well.  Pt limited by weakness,  decreased O2 sats and poor activity tolerance.  Pt with increased swelling in BLEss.    Exercises        Assessment/Plan    PT Assessment Patient needs continued PT services  PT Diagnosis Difficulty walking;Generalized weakness;Acute pain   PT Problem List Decreased strength;Decreased range of motion;Decreased activity tolerance;Decreased balance;Decreased mobility;Decreased knowledge of use of DME;Pain  PT Treatment Interventions DME instruction;Gait training;Functional mobility training;Therapeutic activities;Patient/family education;Balance training;Therapeutic exercise   PT Goals (Current goals can be found in the Care Plan section) Acute Rehab PT Goals Patient Stated Goal: to get independent again. PT Goal Formulation: With patient Time For Goal Achievement: 12/15/14 Potential to Achieve Goals: Fair    Frequency Min 3X/week   Barriers to discharge        Co-evaluation PT/OT/SLP Co-Evaluation/Treatment: Yes Reason for Co-Treatment: For patient/therapist safety PT goals addressed during session: Mobility/safety with mobility OT goals addressed during session: ADL's and self-care       End of Session Equipment Utilized During Treatment: Gait belt;Oxygen Activity Tolerance: Patient limited by fatigue Patient left: in chair;with call bell/phone within reach;with chair alarm set           Time: 0912-0925 PT Time Calculation (min) (ACUTE ONLY): 13 min   Charges:   PT Evaluation $Initial PT Evaluation Tier I: 1 Procedure     PT G Codes:        Rebeca AlertJannie Kylan Liberati, MPT Pager: [phone]

## 2014-12-01 NOTE — Consult Note (Addendum)
Reason for Consult: Acute renal failure on chronic kidney disease stage III (baseline creatinine about 1.2) Referring Physician: Clydia Llano M.D. [facility])  HPI:  81 year old Caucasian woman with past medical history significant for hypertension, diastolic heart failure (EF 60%), coronary artery disease, history of AAA status post repair, history of renal artery stenosis (unknown management per son), history of aortic stenosis and chronic kidney disease stage III at baseline (creatinine 1.2-1.3).  Brought to the emergency room yesterday on recommendation from her primary care provider who noted her to have an elevated creatinine that had risen from 1.2 to 7.3 with progressive worsening of anemia and thrombocytopenia. Preceding this, she had a one-week history of worsening lower extremity weakness, lower extremity pain suggestive of gout with worsening pedal edema/weeping from her legs and worsening shortness of breath. Her son reports that her edema improved with escalation of diuretics but shortness of breath did not. He reports that she has not used any NSAIDs and has not recently been on antibiotics. He does report that recently allopurinol was increased from 100 mg daily to 100 mg twice a day. Denies any chest pain, fever, chills, dysuria, urgency, frequency, flank pain or hematuria. Denies any acute skin rashes. Denies any persistent epistaxis or sinusitis type symptoms. No prior history of acute renal failure, recurrent urinary tract infection or kidney stones per her son. Not on any RAS blocking agents and no recent exposure to iodinated intravenous contrast. Her son reports that her oral intake over the past week has not been optimal. She has had a rather rocky course over the past 6 months following admission to the hospital with cellulitis and deconditioning as well as worsening of her chronic hypercarbic/hypoxemic respiratory failure.   Past Medical History  Diagnosis Date  . Coronary artery  disease     No major obstruction in the left system.  100 RCA occlusion treated with a Taxus stent July 2006  . Hypertension   . Renal insufficiency   . CHF (congestive heart failure) (HCC)   . Stenosis of aortic valve   . Abdominal aortic aneurysm (HCC)   . Renal artery stenosis (HCC)   . Glaucoma   . Cataract   . Rhinitis   . Hypoxemia     Past Surgical History  Procedure Laterality Date  . Angioplasty      with stent  . Total abdominal hysterectomy    . Tubal ligation    . Appendectomy    . Cataract surgery    . Hernia repair    . Tumor removal    . Abdominal aortic aneurysm repair      Family History  Problem Relation Age of Onset  . Cancer Father     Lung  . Coronary artery disease Brother 60  . Coronary artery disease Brother     Social History:  reports that she quit smoking about 34 years ago. Her smoking use included Cigarettes. She has a 7.5 pack-year smoking history. She does not have any smokeless tobacco history on file. She reports that she does not drink alcohol or use illicit drugs.  Allergies:  Allergies  Allergen Reactions  . Beta Adrenergic Blockers Other (See Comments)    REACTION: bradycardia    Medications:  Scheduled: . amLODipine  5 mg Oral Daily  . antiseptic oral rinse  7 mL Mouth Rinse BID  . ferrous sulfate  325 mg Oral Q breakfast  . multivitamin  1 tablet Oral Daily  . pravastatin  10 mg Oral q1800  .  sodium chloride  3 mL Intravenous Q12H    BMP Latest Ref Rng 12/01/2014 12/07/2014 02/04/2014  Glucose 65 - 99 mg/dL 85 098(J154(H) 191(Y102(H)  BUN 6 - 20 mg/dL 782(N130(H) 562(Z132(H) 30(Q29(H)  Creatinine 0.44 - 1.00 mg/dL 6.57(Q7.08(H) 4.69(G7.34(H) 2.95(M1.24(H)  Sodium 135 - 145 mmol/L 138 136 144  Potassium 3.5 - 5.1 mmol/L 4.4 4.5 4.6  Chloride 101 - 111 mmol/L 95(L) 91(L) 88(L)  CO2 22 - 32 mmol/L 24 25 39(H)  Calcium 8.9 - 10.3 mg/dL 7.5(L) 7.5(L) 9.7   CBC Latest Ref Rng 12/01/2014 12/17/2014 02/03/2014  WBC 4.0 - 10.5 K/uL 12.1(H) 13.8(H) 5.2  Hemoglobin 12.0  - 15.0 g/dL 7.6(L) 8.1(L) 11.6(L)  Hematocrit 36.0 - 46.0 % 23.7(L) 25.2(L) 39.9  Platelets 150 - 400 K/uL 40(L) 40(L) 161     Dg Chest 2 View  12/01/2014  CLINICAL DATA:  Shortness of breath and hypoxia beginning today. EXAM: CHEST  2 VIEW COMPARISON:  11/18/2014 and 01/31/2014 FINDINGS: Lungs are adequately inflated with new small left pleural effusion likely with associated atelectasis. Borderline stable cardiomegaly. Findings suggesting a moderate size hiatal hernia unchanged. There is calcified plaque over the thoracoabdominal aorta. Calcified granuloma unchanged over the anterior midlung on the lateral film. Remainder of the exam is unchanged. IMPRESSION: New small left pleural effusion likely with associated atelectasis. Mild stable cardiomegaly. Stable moderate size hiatal hernia. Electronically Signed   By: Elberta Fortisaniel  Boyle M.D.   On: 12/21/2014 16:43   [place] Renal  11/23/2014  CLINICAL DATA:  Acute renal insufficiency EXAM: RENAL / URINARY TRACT ULTRASOUND COMPLETE COMPARISON:  CT scan 12/20/2005 FINDINGS: Right Kidney: Length: 11.0 cm. Normal renal cortical thickness. Diffuse increased echogenicity suggesting medical renal disease. No hydronephrosis. Left Kidney: Length: 8.8 cm. Mild renal cortical thinning and slight increased echogenicity. No hydronephrosis. Bladder: Mild bladder distention.  Ureteral jets not documented. IMPRESSION: 1. Increased echogenicity of the kidneys and mild left renal cortical thinning but no hydronephrosis. 2. Mild bladder distention. Electronically Signed   By: Rudie MeyerP.  Gallerani M.D.   On: 12/05/2014 23:46   Dg Finger Middle Left  12/05/2014  CLINICAL DATA:  Swelling and bruising on tip of finger. History of gout. EXAM: LEFT MIDDLE FINGER 2+V COMPARISON:  None. FINDINGS: Advanced degenerative changes within the IP joints of the left middle finger, most pronounced in the DIP joint. Deformity noted at the DIP joint. No fracture. Diffuse soft tissue swelling. IMPRESSION:  Advanced degenerative changes in the IP joints, most pronounced in the DIP joint with deformity and diffuse soft tissue swelling. No acute bony abnormality. Electronically Signed   By: Charlett NoseKevin  Dover M.D.   On: 12/02/2014 16:43    Review of Systems  Unable to perform ROS: mental acuity  Constitutional:       Patient somnolent and unable to clearly vocalize   Blood pressure 149/61, pulse 81, temperature 97.7 F (36.5 C), temperature source Oral, resp. rate 20, height 5\' 4"  (1.626 m), weight 77.8 kg (171 lb 8.3 oz), SpO2 90 %. Physical Exam  Nursing note and vitals reviewed. Constitutional: She appears well-developed and well-nourished. No distress.  HENT:  Head: Normocephalic and atraumatic.  Nose: Nose normal.  Edentulous with dry oral mucosa  Eyes: Conjunctivae and EOM are normal. Pupils are equal, round, and reactive to light. No scleral icterus.  Neck: Normal range of motion. Neck supple. No JVD present.  Cardiovascular: Normal rate and regular rhythm.  Exam reveals no friction rub.   Murmur heard. 3/6 HSM  Respiratory: Effort normal. She has  no wheezes. She has no rales.  Coarse breath sounds bilaterally-no distinct rales or rhonchi  GI: Soft. Bowel sounds are normal. She exhibits no distension. There is no tenderness. There is no rebound.  Musculoskeletal: She exhibits edema.  Neurological:  Somnolent-awakens with shaking  Skin: Skin is warm and dry. No rash noted. There is erythema.  Erythema over her legs bilaterally-not distinctly warm to touch and appear possibly venous stasis changes    Assessment/Plan: 1. Acute renal failure on chronic kidney disease stage III: Etiology based on the history, timeline of events and available database points towards cardiorenal syndrome with CHF exacerbation (possibly from sodium indiscretion) and activation of RAS axis with escalation of diuretic therapy. Differential diagnosis at this time include sustain intravascular volume contraction  with evolution to ATN in the face of diuretic therapy and acute interstitial nephritis. There is lower likelihood that this is an acute glomerulonephritis based on the urinalysis results and the absence of proteinuria however I will screen with an anti-nuclear antibody and ANCA panel. Also likelihood that this is a plasma cell dyscrasia but I will check an SPEP. Given the constellation of anemia, thrombocytopenia and renal failure-there is a question as to whether this is a hemolytic uremic syndrome however, there are no other markers suggestive of hemolysis-hyperbilirubin/LFTs appear to be within acceptable limits. Her renal ultrasound is unremarkable except for features consistent with chronic kidney disease. I will check urine electrolytes to evaluate her fractional excretion of sodium/urea and to determine the need for gentle intravenous fluids (paradoxical in the face of pedal edema and small pleural effusion). Until then, continue to avoid nephrotoxins including iodinated intravenous contrast and NSAIDs. We discussed her marginal candidacy for acute or chronic hemodialysis with her advanced age and baseline functional status over the past few months. 2. Anemia/thrombocytopenia: We'll request for pathology to perform an analysis of her blood smear and to evaluate for possible schistocytes. I have sent out a haptoglobin level and LDH to evaluate for possible hemolysis. Will order Hemoccult stools. She has definite iron deficiency for which I will give her intravenous iron. Agree with discontinuing allopurinol at this time. No indications for transfusion. 3. Chronic diastolic heart failure/respiratory failure: Continue supplemental oxygen and hold diuretics at this time for fear of worsening BUN and tipping her over into uremia. Await urine electrolytes.  4. Hypertension, peripheral vascular disease and history of CAD: I agree with holding antiplatelet agents at this time because of thrombocytopenia and  bleeding risk. Blood pressure a little elevated but acceptable-monitor closely. 5. Lower extremity swelling/erythema: With her leukocytosis, I would recommend broad-spectrum antibiotic coverage for possible cellulitis.  Lecia Esperanza K. 12/01/2014, 12:23 PM

## 2014-12-02 DIAGNOSIS — I739 Peripheral vascular disease, unspecified: Secondary | ICD-10-CM

## 2014-12-02 LAB — RENAL FUNCTION PANEL
ALBUMIN: 2.7 g/dL — AB (ref 3.5–5.0)
ANION GAP: 18 — AB (ref 5–15)
BUN: 120 mg/dL — ABNORMAL HIGH (ref 6–20)
CALCIUM: 8 mg/dL — AB (ref 8.9–10.3)
CO2: 23 mmol/L (ref 22–32)
Chloride: 95 mmol/L — ABNORMAL LOW (ref 101–111)
Creatinine, Ser: 6.88 mg/dL — ABNORMAL HIGH (ref 0.44–1.00)
GFR calc Af Amer: 5 mL/min — ABNORMAL LOW (ref >60)
GFR, EST NON AFRICAN AMERICAN: 4 mL/min — AB (ref >60)
GLUCOSE: 103 mg/dL — AB (ref 65–99)
PHOSPHORUS: 11.4 mg/dL — AB (ref 2.5–4.6)
POTASSIUM: 4.2 mmol/L (ref 3.5–5.1)
SODIUM: 136 mmol/L (ref 135–145)

## 2014-12-02 LAB — MAGNESIUM: Magnesium: 1.9 mg/dL (ref 1.7–2.4)

## 2014-12-02 LAB — PROTEIN ELECTROPHORESIS, SERUM
A/G RATIO SPE: 0.9 (ref 0.7–1.7)
Albumin ELP: 2.6 g/dL — ABNORMAL LOW (ref 2.9–4.4)
Alpha-1-Globulin: 0.5 g/dL — ABNORMAL HIGH (ref 0.0–0.4)
Alpha-2-Globulin: 1 g/dL (ref 0.4–1.0)
BETA GLOBULIN: 0.9 g/dL (ref 0.7–1.3)
GLOBULIN, TOTAL: 2.8 g/dL (ref 2.2–3.9)
Gamma Globulin: 0.5 g/dL (ref 0.4–1.8)
Total Protein ELP: 5.4 g/dL — ABNORMAL LOW (ref 6.0–8.5)

## 2014-12-02 LAB — CBC WITH DIFFERENTIAL/PLATELET
BASOS ABS: 0 10*3/uL (ref 0.0–0.1)
BASOS PCT: 0 %
EOS PCT: 2 %
Eosinophils Absolute: 0.2 10*3/uL (ref 0.0–0.7)
HCT: 23.2 % — ABNORMAL LOW (ref 36.0–46.0)
Hemoglobin: 7.2 g/dL — ABNORMAL LOW (ref 12.0–15.0)
LYMPHS PCT: 36 %
Lymphs Abs: 4.9 10*3/uL — ABNORMAL HIGH (ref 0.7–4.0)
MCH: 26.1 pg (ref 26.0–34.0)
MCHC: 31 g/dL (ref 30.0–36.0)
MCV: 84.1 fL (ref 78.0–100.0)
MONO ABS: 0.5 10*3/uL (ref 0.1–1.0)
Monocytes Relative: 3 %
NEUTROS ABS: 8 10*3/uL — AB (ref 1.7–7.7)
Neutrophils Relative %: 59 %
PLATELETS: 43 10*3/uL — AB (ref 150–400)
RBC: 2.76 MIL/uL — AB (ref 3.87–5.11)
RDW: 16.2 % — AB (ref 11.5–15.5)
WBC: 13.6 10*3/uL — AB (ref 4.0–10.5)

## 2014-12-02 LAB — HAPTOGLOBIN: HAPTOGLOBIN: 411 mg/dL — AB (ref 34–200)

## 2014-12-02 LAB — ANTINUCLEAR ANTIBODIES, IFA: ANA Ab, IFA: NEGATIVE

## 2014-12-02 LAB — UREA NITROGEN, URINE: UREA NITROGEN UR: 383 mg/dL

## 2014-12-02 MED ORDER — ALBUMIN HUMAN 25 % IV SOLN
25.0000 g | Freq: Once | INTRAVENOUS | Status: AC
  Administered 2014-12-02: 25 g via INTRAVENOUS
  Filled 2014-12-02: qty 100

## 2014-12-02 MED ORDER — ALBUTEROL SULFATE HFA 108 (90 BASE) MCG/ACT IN AERS: 2.0000 | INHALATION_SPRAY | RESPIRATORY_TRACT | Status: DC | PRN

## 2014-12-02 MED ORDER — ALBUTEROL SULFATE (2.5 MG/3ML) 0.083% IN NEBU
2.5000 mg | INHALATION_SOLUTION | RESPIRATORY_TRACT | Status: DC | PRN
  Administered 2014-12-02 – 2014-12-03 (×2): 2.5 mg via RESPIRATORY_TRACT
  Filled 2014-12-02 (×3): qty 3

## 2014-12-02 NOTE — Clinical Social Work Placement (Signed)
   CLINICAL SOCIAL WORK PLACEMENT  NOTE  Date:  12/02/2014  Patient Details  Name: Jaymes GraffMarion L Cadogan MRN: [id number] Date of Birth: 04/09/1915  Clinical Social Work is seeking post-discharge placement for this patient at the Skilled  Nursing Facility level of care (*CSW will initial, date and re-position this form in  chart as items are completed):  No   Patient/family provided with [facility]'s list of facilities offering this level of care within the geographic area requested by the patient (or if unable, by the patient's family).  Yes   Patient/family informed of their freedom to choose among providers that offer the needed level of care, that participate in Medicare, Medicaid or managed care program needed by the patient, have an available bed and are willing to accept the patient.  Yes   Patient/family informed of [facility] ownership interest in [facility] and [name], as well as of the fact that they are under no obligation to receive care at these facilities.  PASRR submitted to EDS on       PASRR number received on       Existing PASRR number confirmed on       FL2 transmitted to all facilities in geographic area requested by pt/family on 12/02/14     FL2 transmitted to all facilities within larger geographic area on       Patient informed that his/her managed care company has contracts with or will negotiate with certain facilities, including the following:            Patient/family informed of bed offers received.  Patient chooses bed at       Physician recommends and patient chooses bed at      Patient to be transferred to   on  .  Patient to be transferred to facility by       Patient family notified on   of transfer.  Name of family member notified:        PHYSICIAN Please sign FL2, Please prepare priority discharge summary, including medications     Additional Comment:     _______________________________________________ Liliana Clinealdwell, Daya Dutt Parks, LCSW 12/02/2014, 2:03 PM

## 2014-12-02 NOTE — Clinical Social Work Note (Signed)
Clinical Social Work Assessment  Patient Details  Name: Eileen Cruz MRN: [id number] Date of Birth: 09/13/1915  Date of referral:  12/02/14               Reason for consult:  Facility Placement                Permission sought to share information with:  Family Supports Permission granted to share information::  No  Name::     son at bedside  Agency::     Relationship::     Contact Information:     Housing/Transportation Living arrangements for the past 2 months:  Single Family Home Source of Information:  Adult Children Patient Interpreter Needed:  None Criminal Activity/Legal Involvement Pertinent to Current Situation/Hospitalization:  No - Comment as needed Significant Relationships:  Adult Children, Community Support Lives with:  Adult Children Do you feel safe going back to the place where you live?  No Need for family participation in patient care:  Yes (Comment)  Care giving concerns:  CSW lives with her son but will benefit per PT from a short SNF stay- son and patient agree.   Social Worker assessment / plan:  CSW has completed FL2 and PASARR and SNF search is underway.  Employment status:    Insurance informationAdvertising account executive:  Managed Medicare PT Recommendations:  Skilled Nursing Facility Information / Referral to community resources:  Skilled Nursing Facility  Patient/Family's Response to care:  Patient and son appreciative of the treatment and care she is receiving. She lives with her son and will benefit form SNF rehab prior to returning home.   Patient/Family's Understanding of and Emotional Response to Diagnosis, Current Treatment, and Prognosis:  Son very involved and aware of her diagnosis and treatment plan- He is optimistic she will be able to do short SNf stay and then return home with him.   She was at [facility] in January and this would be first choice.   Emotional Assessment Appearance:  Developmentally appropriate Attitude/Demeanor/Rapport:    Affect (typically  observed):  Appropriate Orientation:  Oriented to Self, Oriented to Place, Oriented to  Time, Oriented to Situation Alcohol / Substance use:  Not Applicable Psych involvement (Current and /or in the community):  No (Comment)  Discharge Needs  Concerns to be addressed:  Discharge Planning Concerns Readmission within the last 30 days:  No Current discharge risk:  None Barriers to Discharge:  No Barriers Identified   Liliana ClineCaldwell, Bijal Siglin Parks, LCSW 12/02/2014, 1:55 PM

## 2014-12-02 NOTE — Progress Notes (Addendum)
Patient ID: Eileen Cruz, female   DOB: 06/13/1915, 81 y.o.   MRN: [id number]  [facility] Progress Note    Assessment/ Plan:   1. Acute renal failure on chronic kidney disease stage III: Non-oliguric. Suspected to be cardiorenal etiology given recent volume overload and attempts at diuresis. She continues to have interstitial excess with pedal edema but no florid pulmonary edema. Creatinine better overnight and I will give IV albumin today for intravascular expansion without significant volume overload. Renal ultrasound previously done negative for obstruction and fractional excretion of sodium 8% (may have been from recent furosemide use). Negative testing for hemolysis.  2. Anemia/thrombocytopenia: With severe iron deficiency-prescribed intravenous iron infusion. Hemolytic markers negative-LDH is within normal and haptoglobin is in fact elevated. Awaiting Hemoccult results and blood smear reading. 3. Chronic diastolic heart failure/respiratory failure: Pedal edema better-likely because fluid is shifted now towards her back. We'll give IV albumin to try and increase oncotic pressure/mobilize interstitial fluid.  4. Hypertension, peripheral vascular disease and history of CAD: Blood pressure acceptable-continue to monitor low sodium diet 5. Lower extremity swelling/erythema: She continues to have leukocytosis without any fever-negative urine culture so far and no definite infiltrate on chest x-ray. Recommend antibiotic coverage for cellulitis given appearance of her lower extremities/left ring finger.  Subjective:   She reports to be feeling better and her son by bedside reports that he has noted decreased edema overnight.    Objective:   BP 136/49 mmHg  Pulse 76  Temp(Src) 98 F (36.7 C) (Oral)  Resp 18  Ht 5\' 4"  (1.626 m)  Wt 77.8 kg (171 lb 8.3 oz)  BMI 29.43 kg/m2  SpO2 91%  Intake/Output Summary (Last 24 hours) at 12/02/14 1141 Last data filed at 12/02/14 0532  Gross  per 24 hour  Intake    117 ml  Output   1150 ml  Net  -1033 ml   Weight change:   Physical Exam: Gen: Comfortably resting in bed-awake and answers questions appropriately CVS: Pulse regular in rate and rhythm, 3/6 holosystolic murmur Resp: Coarse breath sounds bilaterally-no distinct rales or rhonchi Abd: Soft, obese, nontender Ext: 1-2+ lower extremity edema, pitting sacral edema  Imaging: Dg Chest 2 View  12/21/2014  CLINICAL DATA:  Shortness of breath and hypoxia beginning today. EXAM: CHEST  2 VIEW COMPARISON:  11/18/2014 and 01/31/2014 FINDINGS: Lungs are adequately inflated with new small left pleural effusion likely with associated atelectasis. Borderline stable cardiomegaly. Findings suggesting a moderate size hiatal hernia unchanged. There is calcified plaque over the thoracoabdominal aorta. Calcified granuloma unchanged over the anterior midlung on the lateral film. Remainder of the exam is unchanged. IMPRESSION: New small left pleural effusion likely with associated atelectasis. Mild stable cardiomegaly. Stable moderate size hiatal hernia. Electronically Signed   By: Elberta Fortisaniel  Boyle M.D.   On: 03-27-14 16:43   [place] Renal  12/09/2014  CLINICAL DATA:  Acute renal insufficiency EXAM: RENAL / URINARY TRACT ULTRASOUND COMPLETE COMPARISON:  CT scan 12/20/2005 FINDINGS: Right Kidney: Length: 11.0 cm. Normal renal cortical thickness. Diffuse increased echogenicity suggesting medical renal disease. No hydronephrosis. Left Kidney: Length: 8.8 cm. Mild renal cortical thinning and slight increased echogenicity. No hydronephrosis. Bladder: Mild bladder distention.  Ureteral jets not documented. IMPRESSION: 1. Increased echogenicity of the kidneys and mild left renal cortical thinning but no hydronephrosis. 2. Mild bladder distention. Electronically Signed   By: Rudie MeyerP.  Gallerani M.D.   On: 03-27-14 23:46   Dg Finger Middle Left  12/20/2014  CLINICAL DATA:  Swelling  and bruising on tip of finger.  History of gout. EXAM: LEFT MIDDLE FINGER 2+V COMPARISON:  None. FINDINGS: Advanced degenerative changes within the IP joints of the left middle finger, most pronounced in the DIP joint. Deformity noted at the DIP joint. No fracture. Diffuse soft tissue swelling. IMPRESSION: Advanced degenerative changes in the IP joints, most pronounced in the DIP joint with deformity and diffuse soft tissue swelling. No acute bony abnormality. Electronically Signed   By: Charlett Nose M.D.   On: 12/30/14 16:43    Labs: BMET  Recent Labs Lab Dec 30, 2014 1610 12/01/14 0555 12/02/14 0447  NA 136 138 136  K 4.5 4.4 4.2  CL 91* 95* 95*  CO2 GLUCOSE 154* 85 103*  BUN 132* 130* 120*  CREATININE 7.34* 7.08* 6.88*  CALCIUM 7.5* 7.5* 8.0*  PHOS  --   --  11.4*   CBC  Recent Labs Lab 2014/12/30 1610 12/01/14 0555 12/02/14 0447  WBC 13.8* 12.1* 13.6*  NEUTROABS 8.4*  --  8.0*  HGB 8.1* 7.6* 7.2*  HCT 25.2* 23.7* 23.2*  MCV 81.6 82.6 84.1  PLT 40* 40* 43*   Medications:    . amLODipine  5 mg Oral Daily  . antiseptic oral rinse  7 mL Mouth Rinse BID  . ferumoxytol  510 mg Intravenous Q72H  . multivitamin  1 tablet Oral Daily  . pravastatin  10 mg Oral q1800  . sodium chloride  3 mL Intravenous Q12H   Zetta Bills, MD 12/02/2014, 11:41 AM

## 2014-12-02 NOTE — Progress Notes (Signed)
[facility] PROGRESS NOTE  TEIGAN MANNER ZOX:[id number] DOB: [birth year] DOA: 11/26/2014 PCP: Kaleen Mask, MD  Assessment/Plan: Acute renal failure -presented to ED on 11/8 with BUN 132 Creatinine 7.34, GFR 5, with progressive weakness, increased swelling, dyspnea and incresed oxygen demands. -Renal ultrasound negative for hydronephrosis with mild left renal cortical thinning and increased echogenicity. -Fluid challenge and repeat BMP, Creatinine 6.88 , BUN 120, GFR 4 today -Urine electrolytes and urine eosinophiles -SPEP pending -Strict I/O, Net I/O -915 in last 24 hours -IV albumin for intervascular expansion -Likely has some inravascular volume depletion and third spacing secondary to the hypoalbuminemia, albumin to be given.  Chronic Anemia and Thrombocytopenia -worsening since January, PCP following -Iron 25 -B12 354, folate 29.0 -TSH-2.628 -INR 1.12 -Reticulocytes-1.7/50 -follow CBC, Hbg 7.2, HCT 23.2 trending down -haptoglobin elevated and LDH within normal limits, appreciate nephrology -hemoccult stool and blood smear pending -Give IV iron  HTN, PVD, and ASCVD -stable -continue on statin and amlodipine -hold clopidogrel and aspirin in setting of thrombocytopenia -hold lasix in setting of AKI  Chronic respiratory failure - continue supplemental oxygen  Gout -hold allopurinol in setting of pancytopenia  Chronic diastolic HF - hold lasix for now  Lower extremity swelling/erythema -painless, cool to touch today with reduced edema -consider broad spectrum antibiotics in setting of increased erythema, edema or upward trend in WBC  Code Status: DNR Family Communication: none at bedside Disposition Plan: Inpatient  Consultants:  nephrology  Procedures:  none  Antibiotics:  none  HPI/Subjective: Eileen Cruz is a 81 y. o female with history of chronic respiratory failure, PVD with AAA s/p repair (2008) , left renal artery stenosis , carotid  artery disease, ASCVD s/p PCI to RCA (2006) recurrent tophaceous gout, chronic diastolic CHF, hypertension and CKD III who presented to the ED on 11/25/2014 with leg weakness, increased dyspnea,and AKI. In the ED the patient was afebrile and hemodynamically stable. Patient feels improved today, with only complaint of chronic age/arthritic related back pain.  Objective: Filed Vitals:   12/02/14 0529  BP: 136/49  Pulse: 76  Temp: 98 F (36.7 C)  Resp: 18    Intake/Output Summary (Last 24 hours) at 12/02/14 1022 Last data filed at 12/02/14 0532  Gross per 24 hour  Intake    117 ml  Output   1150 ml  Net  -1033 ml   Filed Weights   12/12/2014 2106  Weight: 77.8 kg (171 lb 8.3 oz)    Exam:  General: Well nourished, well developed in no acute distress  Cardiovascular: regular rate and rhythm to anterior ascultation  Respiratory: Normal respiratory effort, no wheezes, rales or rhonchi.  Abdomen: Soft non-tender , non distended.  Musculoskeletal: Tophi and swelling of left third digit. No edema  Skin: warm , dry , intact  Psych: Normal affect and mood.  Data Reviewed: Basic Metabolic Panel:  Recent Labs Lab 12/10/2014 1610 12/01/14 0555 12/02/14 0447  NA 136 138 136  K 4.5 4.4 4.2  CL 91* 95* 95*  CO2 GLUCOSE 154* 85 103*  BUN 132* 130* 120*  CREATININE 7.34* 7.08* 6.88*  CALCIUM 7.5* 7.5* 8.0*  MG  --   --  1.9  PHOS  --   --  11.4*   Liver Function Tests:  Recent Labs Lab 12/20/2014 1610 12/02/14 0447  AST 18  --   ALT 13*  --   ALKPHOS 47  --   BILITOT 0.5  --   PROT 6.6  --  ALBUMIN 2.8* 2.7*   No results for input(s): LIPASE, AMYLASE in the last 168 hours. No results for input(s): AMMONIA in the last 168 hours. CBC:  Recent Labs Lab 11/28/2014 1610 12/01/14 0555 12/02/14 0447  WBC 13.8* 12.1* 13.6*  NEUTROABS 8.4*  --  8.0*  HGB 8.1* 7.6* 7.2*  HCT 25.2* 23.7* 23.2*  MCV 81.6 82.6 84.1  PLT 40* 40* 43*   Cardiac Enzymes: No  results for input(s): CKTOTAL, CKMB, CKMBINDEX, TROPONINI in the last 168 hours. BNP (last 3 results)  Recent Labs  01/31/14 1714 12/11/2014 1610  BNP 109.8* 174.6*    ProBNP (last 3 results) No results for input(s): PROBNP in the last 8760 hours.  CBG: No results for input(s): GLUCAP in the last 168 hours.  No results found for this or any previous visit (from the past 240 hour(s)).   Studies: Dg Chest 2 View  12/14/2014  CLINICAL DATA:  Shortness of breath and hypoxia beginning today. EXAM: CHEST  2 VIEW COMPARISON:  11/18/2014 and 01/31/2014 FINDINGS: Lungs are adequately inflated with new small left pleural effusion likely with associated atelectasis. Borderline stable cardiomegaly. Findings suggesting a moderate size hiatal hernia unchanged. There is calcified plaque over the thoracoabdominal aorta. Calcified granuloma unchanged over the anterior midlung on the lateral film. Remainder of the exam is unchanged. IMPRESSION: New small left pleural effusion likely with associated atelectasis. Mild stable cardiomegaly. Stable moderate size hiatal hernia. Electronically Signed   By: Elberta Fortis M.D.   On: 12/09/2014 16:43   US Renal  12/18/2014  CLINICAL DATA:  Acute renal insufficiency EXAM: RENAL / URINARY TRACT ULTRASOUND COMPLETE COMPARISON:  CT scan 12/20/2005 FINDINGS: Right Kidney: Length: 11.0 cm. Normal renal cortical thickness. Diffuse increased echogenicity suggesting medical renal disease. No hydronephrosis. Left Kidney: Length: 8.8 cm. Mild renal cortical thinning and slight increased echogenicity. No hydronephrosis. Bladder: Mild bladder distention.  Ureteral jets not documented. IMPRESSION: 1. Increased echogenicity of the kidneys and mild left renal cortical thinning but no hydronephrosis. 2. Mild bladder distention. Electronically Signed   By: Rudie Meyer M.D.   On: 11/28/2014 23:46   Dg Finger Middle Left  12/17/2014  CLINICAL DATA:  Swelling and bruising on tip of  finger. History of gout. EXAM: LEFT MIDDLE FINGER 2+V COMPARISON:  None. FINDINGS: Advanced degenerative changes within the IP joints of the left middle finger, most pronounced in the DIP joint. Deformity noted at the DIP joint. No fracture. Diffuse soft tissue swelling. IMPRESSION: Advanced degenerative changes in the IP joints, most pronounced in the DIP joint with deformity and diffuse soft tissue swelling. No acute bony abnormality. Electronically Signed   By: Charlett Nose M.D.   On: 11/27/2014 16:43    Scheduled Meds: . amLODipine  5 mg Oral Daily  . antiseptic oral rinse  7 mL Mouth Rinse BID  . ferumoxytol  510 mg Intravenous Q72H  . multivitamin  1 tablet Oral Daily  . pravastatin  10 mg Oral q1800  . sodium chloride  3 mL Intravenous Q12H   Continuous Infusions:   Principal Problem:   Acute renal failure (ARF) (HCC) Active Problems:   Essential hypertension   Chronic respiratory failure (HCC)   CAD (coronary artery disease)   Peripheral vascular disease (HCC)   Anemia, unspecified   Thrombocytopenia (HCC)   Pressure ulcer    Time spent: 60 minutes    Edgar Frisk  PA-S wrote parts of this note [facility]  If 7PM-7AM, please contact night-coverage at [email or web address], password  [email or web address] 12/02/2014, 10:22 AM  LOS: 2 days       Clint LippsMutaz A Glennis Montenegro Pager: [phone] 12/02/2014, 2:35 PM

## 2014-12-03 LAB — RENAL FUNCTION PANEL
ANION GAP: 17 — AB (ref 5–15)
Albumin: 3 g/dL — ABNORMAL LOW (ref 3.5–5.0)
BUN: 127 mg/dL — ABNORMAL HIGH (ref 6–20)
CALCIUM: 8.4 mg/dL — AB (ref 8.9–10.3)
CHLORIDE: 96 mmol/L — AB (ref 101–111)
CO2: 24 mmol/L (ref 22–32)
Creatinine, Ser: 6.62 mg/dL — ABNORMAL HIGH (ref 0.44–1.00)
GFR calc non Af Amer: 5 mL/min — ABNORMAL LOW (ref >60)
GFR, EST AFRICAN AMERICAN: 5 mL/min — AB (ref >60)
GLUCOSE: 102 mg/dL — AB (ref 65–99)
POTASSIUM: 4.3 mmol/L (ref 3.5–5.1)
Phosphorus: 10.5 mg/dL — ABNORMAL HIGH (ref 2.5–4.6)
Sodium: 137 mmol/L (ref 135–145)

## 2014-12-03 LAB — MPO/PR-3 (ANCA) ANTIBODIES

## 2014-12-03 LAB — URINALYSIS, ROUTINE W REFLEX MICROSCOPIC
Bilirubin Urine: NEGATIVE
Glucose, UA: NEGATIVE mg/dL
KETONES UR: NEGATIVE mg/dL
NITRITE: NEGATIVE
PH: 5.5 (ref 5.0–8.0)
PROTEIN: 30 mg/dL — AB
Specific Gravity, Urine: 1.01 (ref 1.005–1.030)
Urobilinogen, UA: 0.2 mg/dL (ref 0.0–1.0)

## 2014-12-03 LAB — CBC
HEMATOCRIT: 23.3 % — AB (ref 36.0–46.0)
HEMOGLOBIN: 7.3 g/dL — AB (ref 12.0–15.0)
MCH: 26.5 pg (ref 26.0–34.0)
MCHC: 31.3 g/dL (ref 30.0–36.0)
MCV: 84.7 fL (ref 78.0–100.0)
PLATELETS: 46 10*3/uL — AB (ref 150–400)
RBC: 2.75 MIL/uL — AB (ref 3.87–5.11)
RDW: 16.2 % — ABNORMAL HIGH (ref 11.5–15.5)
WBC: 14.5 10*3/uL — AB (ref 4.0–10.5)

## 2014-12-03 LAB — URINE MICROSCOPIC-ADD ON

## 2014-12-03 LAB — MAGNESIUM: Magnesium: 2 mg/dL (ref 1.7–2.4)

## 2014-12-03 MED ORDER — FLUTICASONE PROPIONATE 50 MCG/ACT NA SUSP
2.0000 | Freq: Every day | NASAL | Status: DC
  Administered 2014-12-03: 2 via NASAL
  Filled 2014-12-03: qty 16

## 2014-12-03 MED ORDER — DOXYCYCLINE HYCLATE 100 MG PO TABS
100.0000 mg | ORAL_TABLET | Freq: Two times a day (BID) | ORAL | Status: DC
  Administered 2014-12-03 (×2): 100 mg via ORAL
  Filled 2014-12-03 (×3): qty 1

## 2014-12-03 MED ORDER — ALBUMIN HUMAN 25 % IV SOLN
25.0000 g | Freq: Once | INTRAVENOUS | Status: AC
  Administered 2014-12-03: 25 g via INTRAVENOUS
  Filled 2014-12-03: qty 100

## 2014-12-03 MED ORDER — FUROSEMIDE 10 MG/ML IJ SOLN
80.0000 mg | Freq: Once | INTRAMUSCULAR | Status: AC
  Administered 2014-12-03: 80 mg via INTRAVENOUS
  Filled 2014-12-03: qty 8

## 2014-12-03 MED ORDER — LORATADINE 10 MG PO TABS
10.0000 mg | ORAL_TABLET | Freq: Every day | ORAL | Status: DC
  Administered 2014-12-03: 10 mg via ORAL
  Filled 2014-12-03 (×3): qty 1

## 2014-12-03 NOTE — Progress Notes (Signed)
Physical Therapy Treatment Patient Details Name: Eileen Cruz MRN: [id number] DOB: 03-May-1915 Today's Date: 12/03/2014    History of EVONNA STOLTZlness 81 yo female admitted with acute renal failure. Hx of AAA repair, CHF, PVD,chronic restrictive lung disease, CKD, HTN, macular degeneration    PT Comments    Pt. Presenting very fatigued today; declined sitting in recliner; assist pt. From supine to sit requiring assist to bring trunk upright; upon sitting EOB pt. Able to balance with UE; STS from EOB to RW and then SPT to [facility] requiring mod assist to stabilize/external assist to correct unsteady/shakyness; SPT back to bed, then small steps sideways on side of bed to reposition towards HOB, pt. Presented very unsteady and immediately sat down on bed; required mod assist to reposition/use of bed pad to scoot up in bed on 4 lts nasal; o2 sats dropped to mid 80s with activity, HR stayed under 100.  Very weak.  HgB 7.3  Follow Up Recommendations  SNF     Equipment Recommendations  None recommended by PT    Recommendations for Other Services       Precautions / Restrictions Precautions Precautions: Fall Precaution Comments: monitor O2 sats Restrictions Weight Bearing Restrictions: No    Mobility  Bed Mobility Overal bed mobility: Needs Assistance;+2 for physical assistance Bed Mobility: Sit to Supine;Supine to Sit     Supine to sit: Mod assist Sit to supine: Mod assist   General bed mobility comments: requires mod assist to bring trunk upright and then assist to reposition/scoot up in bed   Transfers Overall transfer level: Needs assistance Equipment used: Rolling walker (2 wheeled) Transfers: Sit to/from Stand Sit to Stand: Mod assist;+2 physical assistance;+2 safety/equipment Stand pivot transfers: Mod assist;+2 physical assistance;+2 safety/equipment       General transfer comment: Noted jerkiness/shakiness, LE weakness, especially to R side. Assist to rise, stabilize,  control descent. Stand pivot from bed to [facility] with RW and mod assist x2. O2 sats dropped to mid 80's on 4L, pt fatigues very quickly/easily.   Ambulation/Gait    just a few side steps to [facility] + 2 assist RW Very weak         General Gait Details: NT-pt unable today   Stairs            Wheelchair Mobility    Modified Rankin (Stroke Patients Only)       Balance                                    Cognition Arousal/Alertness: Awake/alert Behavior During Therapy: WFL for tasks assessed/performed Overall Cognitive Status: Within Functional Limits for tasks assessed                      Exercises      General Comments        Pertinent Vitals/Pain Pain Assessment: No/denies pain    Home Living                      Prior Function            PT Goals (current goals can now be found in the care plan section) Acute Rehab PT Goals Time For Goal Achievement: 12/15/14 Potential to Achieve Goals: Fair Progress towards PT goals: Progressing toward goals    Frequency  Min 3X/week    PT Plan      Co-evaluation  End of Session Equipment Utilized During Treatment: Gait belt;Oxygen Activity Tolerance: Patient limited by fatigue Patient left: in bed;with call bell/phone within reach;with bed alarm set     Time: 1420-1445 PT Time Calculation (min) (ACUTE ONLY): 25 min  Charges:  $Gait Training: 8-22 mins $Therapeutic Activity: 8-22 mins                    G CodesMarin Comment:      Morgan Salzler-Albaugh, SPTA   12/03/2014 3:00 PM   Pager: [phone]   Reviewed and agree with above Felecia ShellingLori Dawne Casali  PTA [facility]  Acute  Rehab Pager      [phone]

## 2014-12-03 NOTE — Progress Notes (Signed)
SNF bed confirmed for patient at [facility] for Monday- updated MD and will plan for dc (if medically stable) on Monday-  Reece LevyJanet Oakes Mccready, MSW, LelandLCSWA  [phone]

## 2014-12-03 NOTE — Progress Notes (Signed)
Patient ID: Eileen GraffMarion L Capps, female   DOB: 02/07/1915, 81 y.o.   MRN: [id number]   [facility] Progress Note    Assessment/ Plan:   1. Acute renal failure on chronic kidney disease stage III: Non-oliguric. Suspected to be cardiorenal etiology. Work up so far negative for ANA, obstruction and plasma cell dyscrasia. Slight improvement in renal function noted. ANCA pending. Will try albumin+lasix today to augment UOP. No HD needs.  2. Anemia/thrombocytopenia: With severe iron deficiency-prescribed intravenous iron infusion. Hemolytic markers negative- FOBT pending (rising BUN without other cause raises concern of upper GIB). 3. Chronic diastolic heart failure/respiratory failure: Will try albumin with lasix and monitor response- she may respond better to torsemide with her low albumin. 4. Hypertension, peripheral vascular disease and history of CAD: Blood pressure acceptable-continue to monitor lon current diet. 5. Lower extremity swelling/erythema: She continues to have rising leukocytosis without any fever-negative urine culture so far and no definite infiltrate on chest x-ray. Will start doxycycline for cellulitis given appearance of her lower extremities/left ring finger.  Subjective:   Reports no emerging complaints and son reports that she had a good night    Objective:   BP 122/50 mmHg  Pulse 77  Temp(Src) 97.9 F (36.6 C) (Oral)  Resp 20  Ht 5\' 4"  (1.626 m)  Wt 77.8 kg (171 lb 8.3 oz)  BMI 29.43 kg/m2  SpO2 96%  Intake/Output Summary (Last 24 hours) at 12/03/14 1123 Last data filed at 12/03/14 0900  Gross per 24 hour  Intake     30 ml  Output   1250 ml  Net  -1220 ml   Weight change:   Physical Exam: Gen: Comfortably resting in bed-awake and answers questions appropriately CVS: Pulse regular in rate and rhythm, 3/6 holosystolic murmur Resp: Coarse breath sounds bilaterally-no distinct rales or rhonchi Abd: Soft, obese, nontender Ext: 1-2+ lower extremity  edema, pitting sacral edema  Imaging: No results found.  Labs: BMET  Recent Labs Lab 12/11/2014 1610 12/01/14 0555 12/02/14 0447 12/03/14 0550  NA 136 138 136 137  K 4.5 4.4 4.2 4.3  CL 91* 95* 95* 96*  CO2 25 24 23 24   GLUCOSE 154* 85 103* 102*  BUN 132* 130* 120* 127*  CREATININE 7.34* 7.08* 6.88* 6.62*  CALCIUM 7.5* 7.5* 8.0* 8.4*  PHOS  --   --  11.4* 10.5*   CBC  Recent Labs Lab 12/20/2014 1610 12/01/14 0555 12/02/14 0447 12/03/14 0550  WBC 13.8* 12.1* 13.6* 14.5*  NEUTROABS 8.4*  --  8.0*  --   HGB 8.1* 7.6* 7.2* 7.3*  HCT 25.2* 23.7* 23.2* 23.3*  MCV 81.6 82.6 84.1 84.7  PLT 40* 40* 43* 46*   Medications:    . albumin human  25 g Intravenous Once   Followed by  . furosemide  80 mg Intravenous Once  . amLODipine  5 mg Oral Daily  . antiseptic oral rinse  7 mL Mouth Rinse BID  . ferumoxytol  510 mg Intravenous Q72H  . multivitamin  1 tablet Oral Daily  . pravastatin  10 mg Oral q1800  . sodium chloride  3 mL Intravenous Q12H   Zetta BillsJay Nellie Pester, MD 12/03/2014, 11:23 AM

## 2014-12-03 NOTE — Progress Notes (Signed)
[facility] PROGRESS NOTE  Eileen Cruz NWG:[id number] DOB: 1915/07/24 DOA: 11/29/2014 PCP: Kaleen Mask, MD  Assessment/Plan: Acute renal failure -presented to ED on 11/8 with BUN 132 Creatinine 7.34, GFR 5, with progressive weakness, increased swelling, dyspnea and incresed oxygen demands. -Renal ultrasound negative for hydronephrosis with mild left renal cortical thinning and increased echogenicity. -Fluid challenge and repeat BMP, Creatinine 6.62 , BUN 127, GFR 5 today -Urine electrolytes and urine eosinophiles -SPEP pending -Strict I/O, Net I/O -1220 in last 24 hours -IV albumin for intervascular expansion -Likely has some inravascular volume depletion and third spacing secondary to the hypoalbuminemia, albumin and lasix given.  Chronic Anemia and Thrombocytopenia -worsening since January, PCP following -Iron 25 -B12 354, folate 29.0 -TSH-2.628 -INR 1.12 -Reticulocytes-1.7/50 -follow CBC, Hbg 7.3, HCT 23.3 stable -haptoglobin elevated and LDH within normal limits, appreciate nephrology -hemoccult stool and blood smear pending -Give IV iron  HTN, PVD, and ASCVD -stable -continue on statin and amlodipine -hold clopidogrel and aspirin in setting of thrombocytopenia -hold lasix in setting of AKI  Chronic respiratory failure - continue supplemental oxygen  Gout -hold allopurinol in setting of pancytopenia  Chronic diastolic HF - hold lasix for now  Lower extremity swelling/erythema -patient complaining of increased pain, warm to touch, mild edema -white count 14.5 with upward trend -started on Unasyn  Allergy with nasal congestion -start on Flonase and loratadine   Code Status:DNR Family Communication:none at bedside Disposition Plan: inpatient    Consultants:  Nephrology  Procedures:  none  Antibiotics: Unasyn start 11/11  HPI/Subjective: Eileen Cruz is a 81 y. o female with history of chronic respiratory failure, PVD with AAA s/p  repair (2008) , left renal artery stenosis , carotid artery disease, ASCVD s/p PCI to RCA (2006) recurrent tophaceous gout, chronic diastolic CHF, hypertension and CKD III who presented to the ED on 12/22/2014 with leg weakness, increased dyspnea,and AKI. In the ED the patient was afebrile and hemodynamically stable. Patient complains of allergic rhinitis and congestion.  Objective: Filed Vitals:   12/03/14 0602  BP: 137/48  Pulse: 77  Temp: 97.9 F (36.6 C)  Resp: 20    Intake/Output Summary (Last 24 hours) at 12/03/14 1004 Last data filed at 12/03/14 0900  Gross per 24 hour  Intake     30 ml  Output   1250 ml  Net  -1220 ml   Filed Weights   12/03/2014 2106  Weight: 77.8 kg (171 lb 8.3 oz)    Exam:   General: Well nourished, well developed in no acute distress  Cardiovascular: regular rate and rhythm to anterior ascultation  Respiratory: Normal respiratory effort, no wheezes, rales or rhonchi.  Abdomen: Soft non-tender , non distended.  Musculoskeletal: Tophi and swelling of left third digit bilaterally. Mild bilateral lower extremity edema. Lower extremities warm to touch and mild pain.  Skin: warm , dry , intact  Psych: Normal affect and mood.    Data Reviewed: Basic Metabolic Panel:  Recent Labs Lab 12/19/2014 1610 12/01/14 0555 12/02/14 0447 12/03/14 0550  NA 136 138 136 137  K 4.5 4.4 4.2 4.3  CL 91* 95* 95* 96*  CO2 GLUCOSE 154* 85 103* 102*  BUN 132* 130* 120* 127*  CREATININE 7.34* 7.08* 6.88* 6.62*  CALCIUM 7.5* 7.5* 8.0* 8.4*  MG  --   --  1.9 2.0  PHOS  --   --  11.4* 10.5*   Liver Function Tests:  Recent Labs Lab 12/05/2014 1610 12/02/14 0447  12/03/14 0550  AST 18  --   --   ALT 13*  --   --   ALKPHOS 47  --   --   BILITOT 0.5  --   --   PROT 6.6  --   --   ALBUMIN 2.8* 2.7* 3.0*   No results for input(s): LIPASE, AMYLASE in the last 168 hours. No results for input(s): AMMONIA in the last 168 hours. CBC:  Recent  Labs Lab 12/17/2014 1610 12/01/14 0555 12/02/14 0447 12/03/14 0550  WBC 13.8* 12.1* 13.6* 14.5*  NEUTROABS 8.4*  --  8.0*  --   HGB 8.1* 7.6* 7.2* 7.3*  HCT 25.2* 23.7* 23.2* 23.3*  MCV 81.6 82.6 84.1 84.7  PLT 40* 40* 43* 46*   Cardiac Enzymes: No results for input(s): CKTOTAL, CKMB, CKMBINDEX, TROPONINI in the last 168 hours. BNP (last 3 results)  Recent Labs  01/31/14 1714 11/25/2014 1610  BNP 109.8* 174.6*    ProBNP (last 3 results) No results for input(s): PROBNP in the last 8760 hours.  CBG: No results for input(s): GLUCAP in the last 168 hours.  No results found for this or any previous visit (from the past 240 hour(s)).   Studies: No results found.  Scheduled Meds: . amLODipine  5 mg Oral Daily  . antiseptic oral rinse  7 mL Mouth Rinse BID  . ferumoxytol  510 mg Intravenous Q72H  . multivitamin  1 tablet Oral Daily  . pravastatin  10 mg Oral q1800  . sodium chloride  3 mL Intravenous Q12H   Continuous Infusions:   Principal Problem:   Acute renal failure (ARF) (HCC) Active Problems:   Essential hypertension   Chronic respiratory failure (HCC)   CAD (coronary artery disease)   Peripheral vascular disease (HCC)   Anemia, unspecified   Thrombocytopenia (HCC)   Pressure ulcer    Time spent: 60 minutes    Edgar FriskJohnson, Bree  PA-S wrote parts of this note [facility]  If 7PM-7AM, please contact night-coverage at [email or web address], password [email or web address] 12/03/2014, 10:04 AM  LOS: 3 days     Clint LippsMutaz A Jacquline Terrill Pager: [phone] 12/03/2014, 12:32 PM

## 2014-12-03 NOTE — Care Management Important Message (Signed)
Important Message  Patient Details  Name: Eileen Cruz MRN: [id number] Date of Birth: 08/19/1915   Medicare Important Message Given:  Yes    Haskell FlirtJamison, Ryah Cribb 12/03/2014, 12:39 PMImportant Message  Patient Details  Name: Eileen Cruz MRN: [id number] Date of Birth: 06/16/1915   Medicare Important Message Given:  Yes    Haskell FlirtJamison, Iliyana Convey 12/03/2014, 12:38 PM

## 2014-12-03 NOTE — Clinical Documentation Improvement (Signed)
Internal Medicine  Can the diagnosis of Non-Pressure Ulcers be further specified?   Document Specific Site    Document Laterality  Document Ulcer Depth - limited to skin breakdown, with fat layer exposed, with muscle necrosis, with bone necrosis  Etiology of Non-Pressure Ulcers - atherosclerosis, chronic venous hypertension, diabetic ulcer, postphlebotic syndrome, postthrombotic syndrome, varicose ulcer, other, unable to clinically determine, with gangrene   Other condition  Unable to clinically determine   Supporting Information: :  Noted in medical record as pressure ulcer as active problem.   Please exercise your independent, professional judgment when responding. A specific answer is not anticipated or expected.   Thank Modesta MessingYou, Docia Klar L [facility] Information Management [place] [phone]

## 2014-12-04 ENCOUNTER — Inpatient Hospital Stay (HOSPITAL_COMMUNITY): Payer: Medicare Other

## 2014-12-04 LAB — RENAL FUNCTION PANEL
ALBUMIN: 2.8 g/dL — AB (ref 3.5–5.0)
ANION GAP: 16 — AB (ref 5–15)
BUN: 137 mg/dL — AB (ref 6–20)
CHLORIDE: 96 mmol/L — AB (ref 101–111)
CO2: 26 mmol/L (ref 22–32)
Calcium: 8.9 mg/dL (ref 8.9–10.3)
Creatinine, Ser: 6.91 mg/dL — ABNORMAL HIGH (ref 0.44–1.00)
GFR calc Af Amer: 5 mL/min — ABNORMAL LOW (ref >60)
GFR, EST NON AFRICAN AMERICAN: 4 mL/min — AB (ref >60)
Glucose, Bld: 127 mg/dL — ABNORMAL HIGH (ref 65–99)
PHOSPHORUS: 10.8 mg/dL — AB (ref 2.5–4.6)
POTASSIUM: 4.6 mmol/L (ref 3.5–5.1)
Sodium: 138 mmol/L (ref 135–145)

## 2014-12-04 LAB — MAGNESIUM: MAGNESIUM: 1.9 mg/dL (ref 1.7–2.4)

## 2014-12-04 MED ORDER — PIPERACILLIN-TAZOBACTAM IN DEX 2-0.25 GM/50ML IV SOLN
2.2500 g | Freq: Three times a day (TID) | INTRAVENOUS | Status: DC
  Administered 2014-12-04: 2.25 g via INTRAVENOUS
  Filled 2014-12-04 (×3): qty 50

## 2014-12-04 MED ORDER — VANCOMYCIN HCL 10 G IV SOLR
1250.0000 mg | Freq: Once | INTRAVENOUS | Status: AC
  Administered 2014-12-04: 1250 mg via INTRAVENOUS
  Filled 2014-12-04: qty 1250

## 2014-12-04 MED ORDER — SODIUM CHLORIDE 0.9 % IV SOLN
INTRAVENOUS | Status: DC
  Administered 2014-12-04: 12:00:00 via INTRAVENOUS

## 2014-12-05 DIAGNOSIS — A419 Sepsis, unspecified organism: Secondary | ICD-10-CM | POA: Diagnosis not present

## 2014-12-06 LAB — URINE CULTURE: Culture: >=100000

## 2014-12-09 LAB — CULTURE, BLOOD (ROUTINE X 2)
CULTURE: NO GROWTH
Culture: NO GROWTH

## 2014-12-09 LAB — ADAMTS13 ACTIVITY: ADAMTS 13 ACTIVITY: 27 % — AB (ref >66)

## 2014-12-09 LAB — ADAMTS13 ANTIBODY: ADAMTS13 Antibody: 3 u/mL (ref <12)

## 2014-12-23 NOTE — Progress Notes (Addendum)
[facility] PROGRESS NOTE  Eileen Cruz IDP:[id number] DOB: 01-04-1916 DOA: 12/18/2014 PCP: Eileen Downing, MD   HPI/Subjective: Had hypothermia overnight with temperature down to 93.3, urinalysis is also consistent with UTI. She had only 600 mils of urine for the past 24 hours, she received 80 of Lasix yesterday.  Assessment/Plan: Acute renal failure -presented to ED on 11/8 with BUN 132 Creatinine 7.34, GFR 5, with progressive weakness, increased swelling, dyspnea and incresed oxygen demands. -Renal ultrasound negative for hydronephrosis with mild left renal cortical thinning and increased echogenicity. -Likely has some inravascular volume depletion and third spacing secondary to the hypoalbuminemia, albumin and lasix given. -Started on trickle IV fluid at 75 mL/hour  Sepsis Likely early sepsis, met sepsis criteria with temperature of 93.3, respiratory rate of 24. Now she is having hypoxia, obtain chest x-ray. Blood cultures, started on Zosyn and vancomycin.  Acute on chronic respiratory failure -Patient is on 2 L of oxygen at home, oxygen requirements increased. -She needed 6 L of oxygen this morning. Likely hypothermia is contributing as well. -Tinea supplemental oxygen MI patient is DNR/DNI.  Chronic Anemia and Thrombocytopenia -worsening since January, PCP following -Iron 25 -B12 354, folate 29.0 -TSH-2.628 -INR 1.12 -Reticulocytes-1.7/50 -follow CBC, Hbg 7.3, HCT 23.3 stable -haptoglobin elevated and LDH within normal limits, appreciate nephrology -Negative for hemolysis  HTN, PVD, and ASCVD -stable -continue on statin and amlodipine -hold clopidogrel and aspirin in setting of thrombocytopenia -hold lasix in setting of AKI  Gout -hold allopurinol in setting of pancytopenia  Lower extremity swelling/erythema -patient complaining of increased pain, warm to touch, mild edema -white count 14.5 with upward trend -started on doxycycline, now continued to  be started on Zosyn and vancomycin  Allergy with nasal congestion -start on Flonase and loratadine   Code Status:DNR Family Communication:none at bedside Disposition Plan: inpatient    Consultants:  Nephrology  Procedures:  none  Antibiotics: Wasn't doxycycline, switched to Zosyn and vancomycin  Objective: Filed Vitals:   12/08/2014 1029  BP: 120/45  Pulse:   Temp:   Resp:     Intake/Output Summary (Last 24 hours) at 12/08/14 1058 Last data filed at Dec 08, 2014 0900  Gross per 24 hour  Intake    480 ml  Output    600 ml  Net   -120 ml   Filed Weights   11/27/2014 2106  Weight: 77.8 kg (171 lb 8.3 oz)    Exam:   General: Well nourished, well developed in no acute distress  Cardiovascular: regular rate and rhythm to anterior ascultation  Respiratory: Normal respiratory effort, no wheezes, rales or rhonchi.  Abdomen: Soft non-tender , non distended.  Musculoskeletal: Tophi and swelling of left third digit bilaterally. Mild bilateral lower extremity edema. Lower extremities warm to touch and mild pain.  Skin: warm , dry , intact  Psych: Normal affect and mood.    Data Reviewed: Basic Metabolic Panel:  Recent Labs Lab 11/28/2014 1610 12/01/14 0555 12/02/14 0447 12/03/14 0550 2014-12-08 0629 2014-12-08 0639  NA 136 138 136 137 138  --   K 4.5 4.4 4.2 4.3 4.6  --   CL 91* 95* 95* 96* 96*  --   CO2 25 24 23 24 26   --   GLUCOSE 154* 85 103* 102* 127*  --   BUN 132* 130* 120* 127* 137*  --   CREATININE 7.34* 7.08* 6.88* 6.62* 6.91*  --   CALCIUM 7.5* 7.5* 8.0* 8.4* 8.9  --   MG  --   --  1.9 2.0  --  1.9  PHOS  --   --  11.4* 10.5* 10.8*  --    Liver Function Tests:  Recent Labs Lab 11/29/2014 1610 12/02/14 0447 12/03/14 0550 12/11/14 0629  AST 18  --   --   --   ALT 13*  --   --   --   ALKPHOS 47  --   --   --   BILITOT 0.5  --   --   --   PROT 6.6  --   --   --   ALBUMIN 2.8* 2.7* 3.0* 2.8*   No results for input(s): LIPASE, AMYLASE in the  last 168 hours. No results for input(s): AMMONIA in the last 168 hours. CBC:  Recent Labs Lab 11/28/2014 1610 12/01/14 0555 12/02/14 0447 12/03/14 0550  WBC 13.8* 12.1* 13.6* 14.5*  NEUTROABS 8.4*  --  8.0*  --   HGB 8.1* 7.6* 7.2* 7.3*  HCT 25.2* 23.7* 23.2* 23.3*  MCV 81.6 82.6 84.1 84.7  PLT 40* 40* 43* 46*   Cardiac Enzymes: No results for input(s): CKTOTAL, CKMB, CKMBINDEX, TROPONINI in the last 168 hours. BNP (last 3 results)  Recent Labs  01/31/14 1714 12/19/2014 1610  BNP 109.8* 174.6*    ProBNP (last 3 results) No results for input(s): PROBNP in the last 8760 hours.  CBG: No results for input(s): GLUCAP in the last 168 hours.  No results found for this or any previous visit (from the past 240 hour(s)).   Studies: No results found.  Scheduled Meds: . amLODipine  5 mg Oral Daily  . antiseptic oral rinse  7 mL Mouth Rinse BID  . doxycycline  100 mg Oral Q12H  . ferumoxytol  510 mg Intravenous Q72H  . fluticasone  2 spray Each Nare Daily  . loratadine  10 mg Oral Daily  . multivitamin  1 tablet Oral Daily  . pravastatin  10 mg Oral q1800  . sodium chloride  3 mL Intravenous Q12H   Continuous Infusions:   Principal Problem:   Acute renal failure (ARF) (HCC) Active Problems:   Essential hypertension   Chronic respiratory failure (HCC)   CAD (coronary artery disease)   Peripheral vascular disease (HCC)   Anemia, unspecified   Thrombocytopenia (HCC)   Pressure ulcer    Time spent: 60 minutes    Eileen Cruz A [facility]  If 7PM-7AM, please contact night-coverage at [email or web address], password Eileen Cruz 12/11/2014, 10:58 AM  LOS: 4 days

## 2014-12-23 NOTE — Progress Notes (Signed)
Patient death pronounced by Tama GanderSophia Lakena Sparlin, RN and Virgel PalingFelicia Wilfong, RN at [phone]. NO Pulse--apical, carotid, brachical, femoral, no BP, respiration, no heart beat or eletrical activity. Family remain at the bedside. NP Lance CoonLaura Harduk report MD will complete death ceriticate on next day. [facility] notified, Bed Placement called  For notification. Will call again upon completion of post  Mortem care. SRP, RN

## 2014-12-23 NOTE — Discharge Summary (Signed)
Death Summary  Eileen Cruz FTD:[id number] DOB: Aug 25, 1915 DOA: 02-Dec-2014  PCP: Leonard Downing, MD PCP/Office notified:   Admit date: December 02, 2014 Date of Death: 12/07/14  Final Diagnoses:  Principal Problem:   Acute renal failure (ARF) ([place]) Active Problems:   Essential hypertension   Chronic respiratory failure (HCC)   CAD (coronary artery disease)   Peripheral vascular disease (HCC)   Anemia, unspecified   Thrombocytopenia ([place])   Pressure ulcer   Sepsis ([place])    1. Sepsis  History of present illness:  Eileen Cruz is a 81 y.o. female with a past medical history significant for PVD with AAA s/p repair in 2008, L renal artery stenosis, and carotid disease, ASCVD s/p PCI to RCA in 2006, recurrent tophaceous gout, chronic diastolic CHF, HTN, CKD III and chronic respiratory failure from restrictive lung disease who presents with leg weakness, dyspnea, and AKI.  Most of the patient's history is collected from her son who is present at the bedside. He describes a period of about 1-2 weeks of progressively worsening generalized weakness, bruising, dyspnea, increased oxygen demands and increased swelling.  The patient was admitted to [facility] in January of this year with cellulitis of the left lower extremity. During that hospitalization she was noted to be oxygen requiring, as a result of her chronic restrictive lung disease (from kyphosis and obesity). She was discharged after that hospitalization to a nursing home for 3 weeks after which she returned home where she lives independently with assistance with groceries and household chores from her son. Since then the patient has been recovering well at home. The son notes that she has had varying degrees of gouty arthritis this summer for which her PCP increased her allopurinol to 100 mg twice daily in the last few weeks. He had also noted her to be anemic, for which he had ordered iron supplements, but per the son, this anemia  was worsening. Lastly, she had had worsening swelling over the last few weeks for which her PCP had increased her Lasix to 40 mg in the morning and 20 mg in the afternoon. He denies that she has been taking NSAIDs. She has had no other new medicines.  In the ED, the patient was afebrile and hemodynamically stable. She was slightly hypoxic. The sodium, potassium, bicarbonate were all normal. The BUN was 132 mg/dL and the serum creatinine is 7.34 mg/dL from a baseline around 1.2 mg/dL and so the patient was admitted for acute renal failure.  Hospital Course:    Sepsis Likely epsis, met sepsis criteria with temperature of 93.3, respiratory rate of 24 and presence of infection (UTI). Now she is having hypoxia, obtain chest x-ray. Developed sepsis, hypothermia and respiratory distress. Started on Zosyn and vancomycin, pancultured. Developed more respiratory distress later, pronounced dead at 20:15  Acute renal failure -presented to ED on 2022/12/02 with BUN 132 Creatinine 7.34, GFR 5, with progressive weakness, increased swelling, dyspnea and incresed oxygen demands. -Renal ultrasound negative for hydronephrosis with mild left renal cortical thinning and increased echogenicity. -Likely has some inravascular volume depletion and third spacing secondary to the hypoalbuminemia, albumin and lasix given.  Acute on chronic respiratory failure -Patient is on 2 L of oxygen at home, oxygen requirements increased. -She needed 6 L of oxygen this morning. Likely hypothermia is contributing as well.  Chronic Anemia and Thrombocytopenia -worsening since January, PCP following -Iron 25 -B12 354, folate 29.0 -TSH-2.628 -INR 1.12 -Reticulocytes-1.7/50 -follow CBC, Hbg 7.3, HCT 23.3 stable -haptoglobin elevated and LDH  within normal limits, appreciate nephrology -Negative for hemolysis  HTN, PVD, and ASCVD -stable -continue on statin and amlodipine -hold clopidogrel and aspirin in setting of  thrombocytopenia -hold lasix in setting of AKI  Gout -hold allopurinol in setting of pancytopenia  Lower extremity swelling/erythema -patient complaining of increased pain, warm to touch, mild edema -white count 14.5 with upward trend -started on doxycycline, now continued to be started on Zosyn and vancomycin  Allergy with nasal congestion -start on Flonase and loratadine   Time of death: 04/13/2013 Date of death 12/28/13  Son was at bedside  Signed:  [facility]  [facility] 12/05/2014, 11:09 AM

## 2014-12-23 NOTE — Progress Notes (Signed)
Rectal tempeture 95.1. M.  Lynch notified. Will continue to monitor.

## 2014-12-23 NOTE — Progress Notes (Signed)
Central Monitoring called to notify of changes on the monitor. Patient has decline in vital signs. Pt show signs of Asystole on the monitor. No electrical activity noted. Family at the bedside. NP extender Lance CoonLaura Harduk notified and gave order for 2 RN to pronounce patient death. SRP, RN

## 2014-12-23 NOTE — Progress Notes (Signed)
ANTIBIOTIC CONSULT NOTE - INITIAL  Pharmacy Consult for Vancomycin and Zosyn Indication: rule out sepsis  Allergies  Allergen Reactions  . Beta Adrenergic Blockers Other (See Comments)    REACTION: bradycardia    Patient Measurements: Height: 5\' 4"  (162.6 cm) Weight: 171 lb 8.3 oz (77.8 kg) IBW/kg (Calculated) : 54.7  Vital Signs: Temp: 95.1 F (35.1 C) (11/12 0630) Temp Source: Rectal (11/12 0630) BP: 120/45 mmHg (11/12 1029) Pulse Rate: 69 (11/12 0548) Intake/Output from previous day: 11/11 0701 - 11/12 0700 In: 270 [P.O.:270] Out: 600 [Urine:600] Intake/Output from this shift: Total I/O In: 240 [P.O.:240] Out: -   Labs:  Recent Labs  12/02/14 0447 12/03/14 0550 11-01-2014 0629  WBC 13.6* 14.5*  --   HGB 7.2* 7.3*  --   PLT 43* 46*  --   CREATININE 6.88* 6.62* 6.91*   Estimated Creatinine Clearance: 4.7 mL/min (by C-G formula based on Cr of 6.91). No results for input(s): VANCOTROUGH, VANCOPEAK, VANCORANDOM, GENTTROUGH, GENTPEAK, GENTRANDOM, TOBRATROUGH, TOBRAPEAK, TOBRARND, AMIKACINPEAK, AMIKACINTROU, AMIKACIN in the last 72 hours.   Microbiology: No results found for this or any previous visit (from the past 720 hour(s)).  Medical History: Past Medical History  Diagnosis Date  . Coronary artery disease     No major obstruction in the left system.  100 RCA occlusion treated with a Taxus stent July 2006  . Hypertension   . Renal insufficiency   . CHF (congestive heart failure) (HCC)   . Stenosis of aortic valve   . Abdominal aortic aneurysm (HCC)   . Renal artery stenosis (HCC)   . Glaucoma   . Cataract   . Rhinitis   . Hypoxemia     Medications:  Scheduled:  . amLODipine  5 mg Oral Daily  . antiseptic oral rinse  7 mL Mouth Rinse BID  . ferumoxytol  510 mg Intravenous Q72H  . fluticasone  2 spray Each Nare Daily  . loratadine  10 mg Oral Daily  . multivitamin  1 tablet Oral Daily  . piperacillin-tazobactam (ZOSYN)  IV  2.25 g Intravenous Q8H   . pravastatin  10 mg Oral q1800  . sodium chloride  3 mL Intravenous Q12H  . vancomycin  1,250 mg Intravenous Once   Infusions:  . sodium chloride     PRN: acetaminophen, albuterol, ondansetron **OR** ondansetron (ZOFRAN) IV  Assessment: 81 y/o F sent to ED 11/8 by PCP after labs showed AKI with worsening anemia and thrombocytopenia. Etiology uncertain but nephrology suspects AKI due to HF exacerbation followed by escalation of diuretic therapy.  Patient now with likely early sepsis.  Obtaining blood cultures and CXR for evaluation of hypoxia.  Pharmacy is consulted to dose vancomycin and Zosyn.  11/11 >> Doxycycline >> 11/12 11/12 >> Vancomycin >> 11/12 >> Zosyn >>  11/11 urine: 11/12 blood x 2:  Goal of Therapy:  Vancomycin trough level 15-20 mcg/ml  Zosyn dose appropriate for renal function Eradication of infection  Plan:   Zosyn 2.25g IV q8h  Vancomycin 1250mg  (~15 mg/kg) IV x 1  Plan to check vancomycin random level in ~48 hours and redose if < 20 mcg/ml Follow up renal function & cultures, clinical course  Loralee PacasErin Stephone Gum, PharmD, BCPS Pager: [phone] 03-16-2014,11:12 AM

## 2014-12-23 NOTE — Progress Notes (Signed)
Patient ID: Eileen Cruz, female   DOB: [birth year], 81 y.o.   MRN: [id number]  [facility] Progress Note    Assessment/ Plan:   1. Acute renal failure on chronic kidney disease stage III: Non-oliguric. Suspected to be cardiorenal etiology and now likely worsened by sepsis/ATN-unfortunately with decreasing urine output. Agree with intravenous fluid support for hypotension/sepsis and monitoring closely. There are no acute indications for dialysis-I have discussed with her son regarding the poor status of her kidneys and the lack of recovery so far that usually means a poor prognosis. I have also informed him of my reservation regarding offering hemodialysis-he remarks that his mother has been fairly functional in the past but would not want any artificial means to prolong her life. Previous workup for GN showed negative ANA, negative ANCA and negative renal ultrasound. No evidence of plasma cell dyscrasia.  2. Anemia/thrombocytopenia: With severe iron deficiency-prescribed intravenous iron infusion. No evidence of hemolysis  3. Chronic diastolic heart failure/respiratory failure: with some increased work of breathing overnight-getting intravenous fluids that will need to be monitored cautiously for worsening of respiratory status.  4. Hypertension, peripheral vascular disease and history of CAD:  blood pressures a little on the lower side coupled with hypothermia and raising the suspicion of sepsis probably of urinary tract origin. 5. Lower extremity swelling/erythema: started empirically yesterday on doxycycline for worsening leukocytosis and suspicious looking lower extremities-switched to vancomycin/Zosyn today. 6. Hypothermia/marginal blood pressures/tachypnea: Meeting sepsis criteria and started on treatment for possible urinary tract infection given findings from urinalysis yesterday (urine cultures pending). Also with increased bilateral pulmonary infiltrates.   Subjective:   Events  from overnight noted-hypothermia, tachypnea and now meeting criteria for sepsis-spectrum antibiotic therapy initiated. Chest x-ray reviewed from this morning. Her son is aware of the setbacks that she has had over the past 24 hours and understands that this might get worse and result in her mortality.    Objective:   BP 120/45 mmHg  Pulse 69  Temp(Src) 95.1 F (35.1 C) (Rectal)  Resp 24  Ht  (1.626 m)  Wt 77.8 kg (171 lb 8.3 oz)  BMI 29.43 kg/m2  SpO2 94%  Intake/Output Summary (Last 24 hours) at Dec 17, 2014 1158 Last data filed at 12-17-14 0900  Gross per 24 hour  Intake    480 ml  Output    600 ml  Net   -120 ml   Weight change:   Physical Exam: Gen: somnolent, appears comfortable resting in bed-on Ventimask  CVS: Pulse regular in rate and rhythm, 3/6 holosystolic murmur Resp: Coarse breath sounds bilaterally-no distinct rales or rhonchi Abd: Soft, obese, nontender Ext: 1-2+ lower extremity edema, pitting sacral edema  Imaging: Dg Chest Port 1 View  12/17/2014  CLINICAL DATA:  SOB, Temp of 93.3 last p.m., and hypoxia. H/o CHF and HTN. Former smoker of 1/2 ppd x 15 years, quit in 1982. EXAM: PORTABLE CHEST 1 VIEW COMPARISON:  11/25/2014 FINDINGS: Heart is enlarged. There is dense opacity at the left lung base obscuring the hemidiaphragm. Coarse parenchymal markings are seen throughout the lungs bilaterally which is increased since prior study. Bilateral pleural effusions present. IMPRESSION: Increased bilateral pulmonary infiltrates. Left lower lobe consolidation. Electronically Signed   By: Norva Pavlov M.D.   On: 12-17-14 11:17    Labs: BMET  Recent Labs Lab 11/24/2014 1610 12/01/14 0555 12/02/14 0447 12/03/14 0550 December 17, 2014 0629  NA 136 138 136 137 138  K 4.5 4.4 4.2 4.3 4.6  CL 91* 95* 95* 96*  96*  CO2 25 24 23 24 26   GLUCOSE 154* 85 103* 102* 127*  BUN 132* 130* 120* 127* 137*  CREATININE 7.34* 7.08* 6.88* 6.62* 6.91*  CALCIUM 7.5* 7.5* 8.0* 8.4* 8.9   PHOS  --   --  11.4* 10.5* 10.8*   CBC  Recent Labs Lab 01/06/15 1610 12/01/14 0555 12/02/14 0447 12/03/14 0550  WBC 13.8* 12.1* 13.6* 14.5*  NEUTROABS 8.4*  --  8.0*  --   HGB 8.1* 7.6* 7.2* 7.3*  HCT 25.2* 23.7* 23.2* 23.3*  MCV 81.6 82.6 84.1 84.7  PLT 40* 40* 43* 46*   Medications:    . amLODipine  5 mg Oral Daily  . antiseptic oral rinse  7 mL Mouth Rinse BID  . ferumoxytol  510 mg Intravenous Q72H  . fluticasone  2 spray Each Nare Daily  . loratadine  10 mg Oral Daily  . multivitamin  1 tablet Oral Daily  . piperacillin-tazobactam (ZOSYN)  IV  2.25 g Intravenous Q8H  . pravastatin  10 mg Oral q1800  . sodium chloride  3 mL Intravenous Q12H  . vancomycin  1,250 mg Intravenous Once   Eileen BillsJay Mayank Teuscher, MD 11/25/2014, 11:58 AM

## 2014-12-23 NOTE — Progress Notes (Signed)
Rectal temp 93.3. Daphane ShepherdM Lynch, NP notified. Will continue to monitor.

## 2014-12-23 DEATH — deceased

## 2016-12-30 IMAGING — CR DG CHEST 2V
2 series · 2 of 2 positions shown · non-contrast
Comparison: Portable chest x-ray January 31, 2014

CLINICAL DATA: Shortness of breath, increasing. On oxygen, history
of chronic respiratory failure, CHF.

EXAM:
CHEST  2 VIEW

[w chest pa]
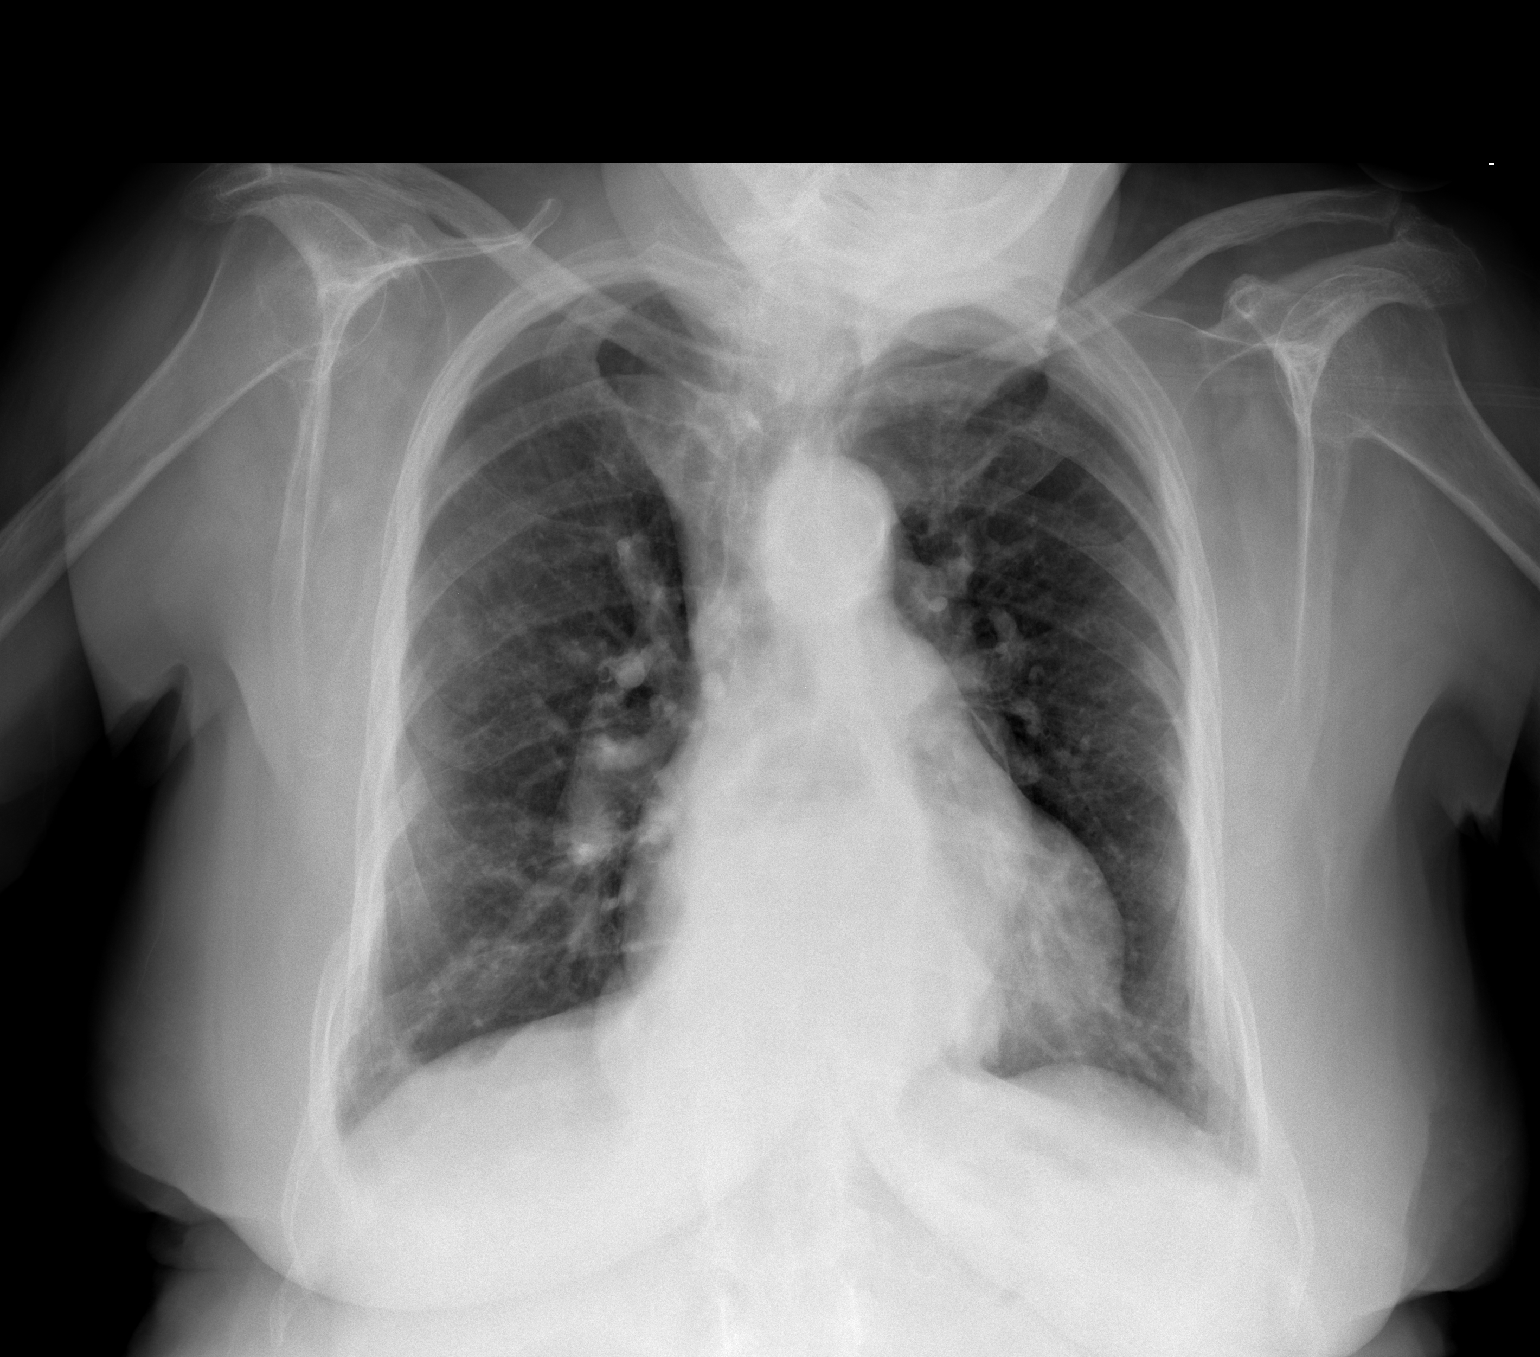

[w chest lat]
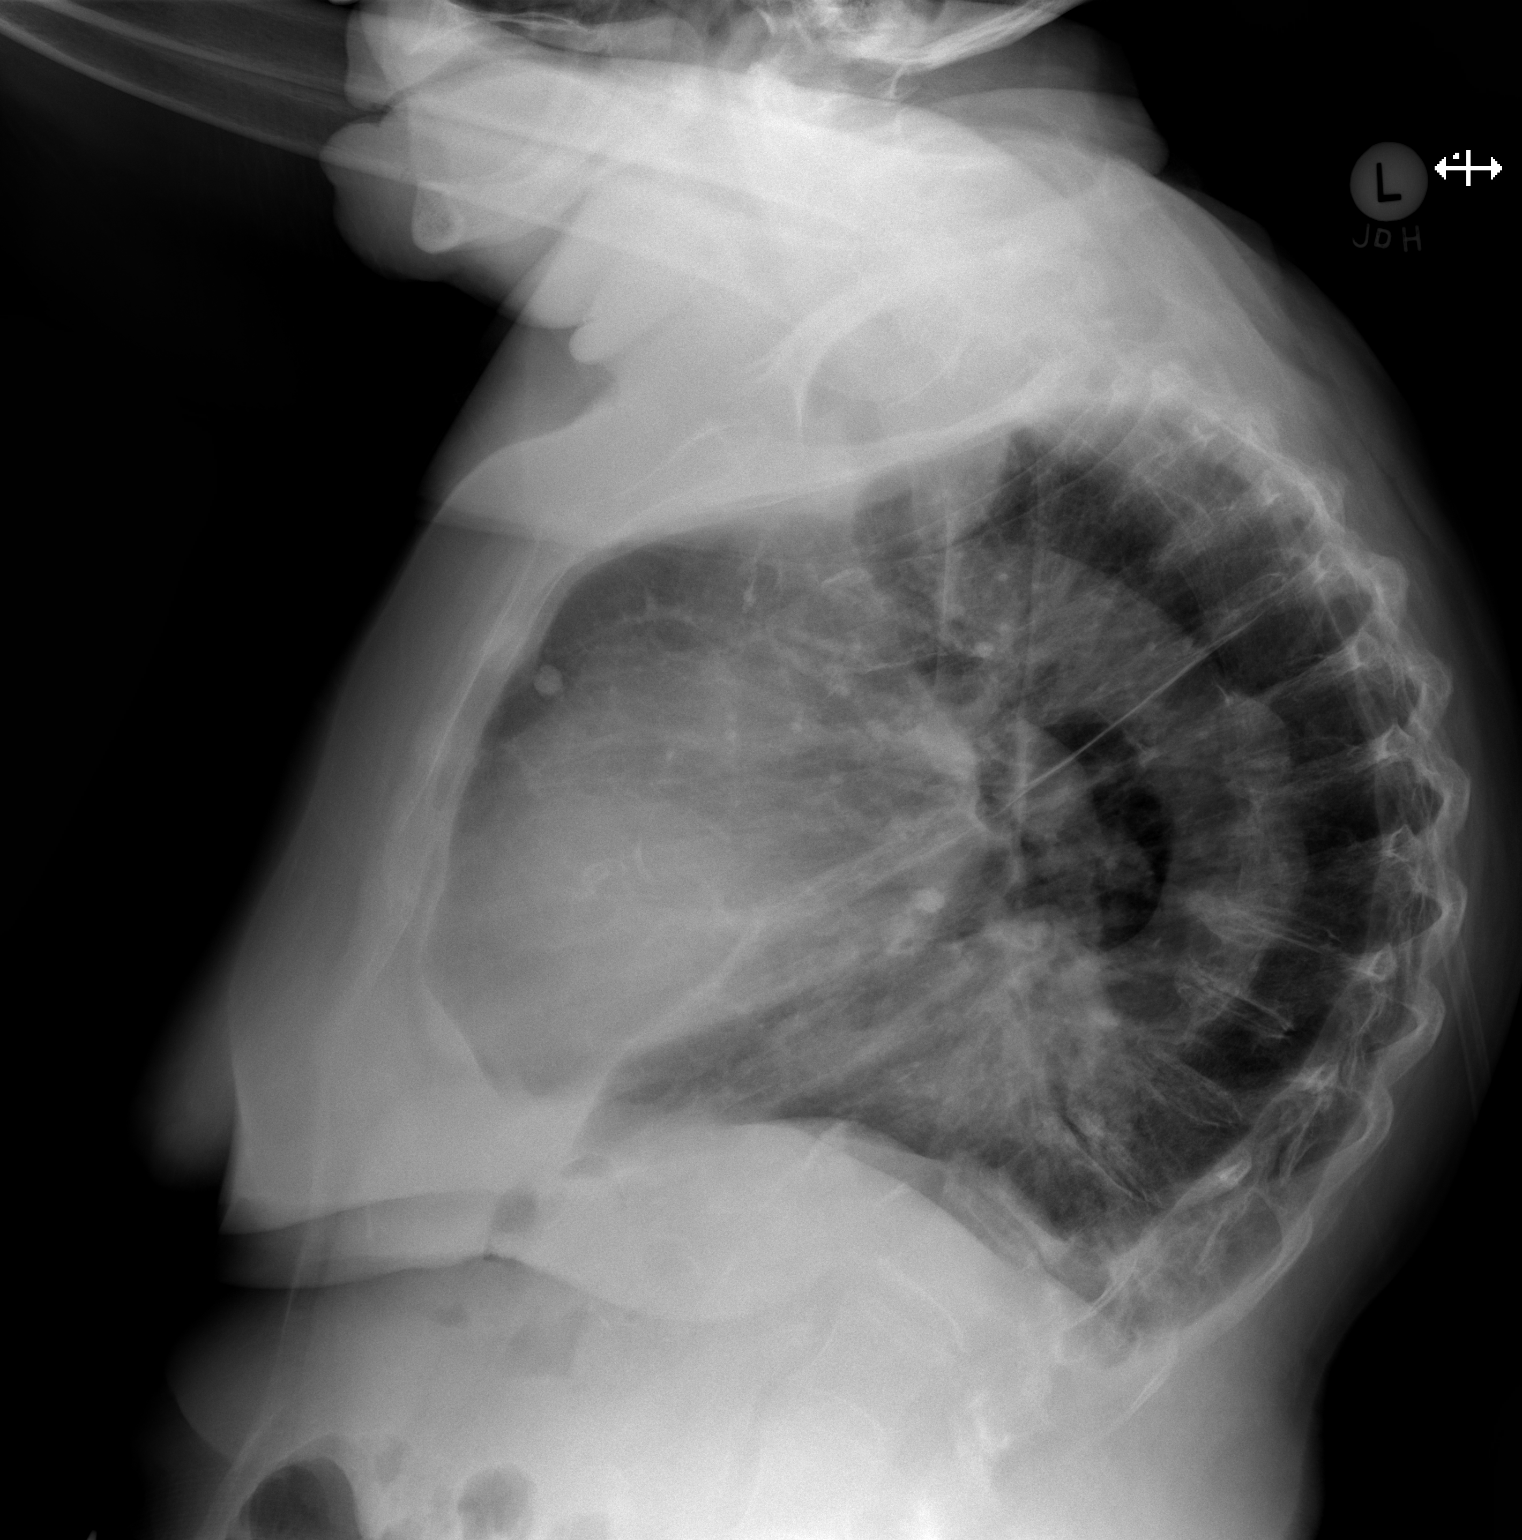

[2 of 2 positions shown; findings below may reference images not displayed]

FINDINGS: The lungs remain hyperinflated with increased AP dimension of the
thorax. There is evidence of previous granulomatous infection with
calcified nodule present in the retrosternal region of the right
lung. The pulmonary interstitial markings are mildly increased
bilaterally. There is no alveolar infiltrate. The heart is normal in
size. The pulmonary vascularity is prominent centrally with mild
cephalization noted. There is a large hiatal hernia with an
air-fluid level. There is prominent thoracic kyphosis without
high-grade compression fractures of the thoracic spine.
IMPRESSION: 1. COPD. There is no evidence of pneumonia. There is evidence of
previous granulomatous infection.
2. Cardiomegaly with mild central pulmonary vascular congestion.
Mild pulmonary interstitial edema is suspected.
3. Large hiatal hernia with an air-fluid level.

## 2017-01-11 IMAGING — CR DG FINGER MIDDLE 2+V*L*
3 series · 3 of 3 positions shown · non-contrast
Comparison: None.

CLINICAL DATA: Swelling and bruising on tip of finger. History of
gout.

EXAM:
LEFT MIDDLE FINGER 2+V

[x finger pa left]
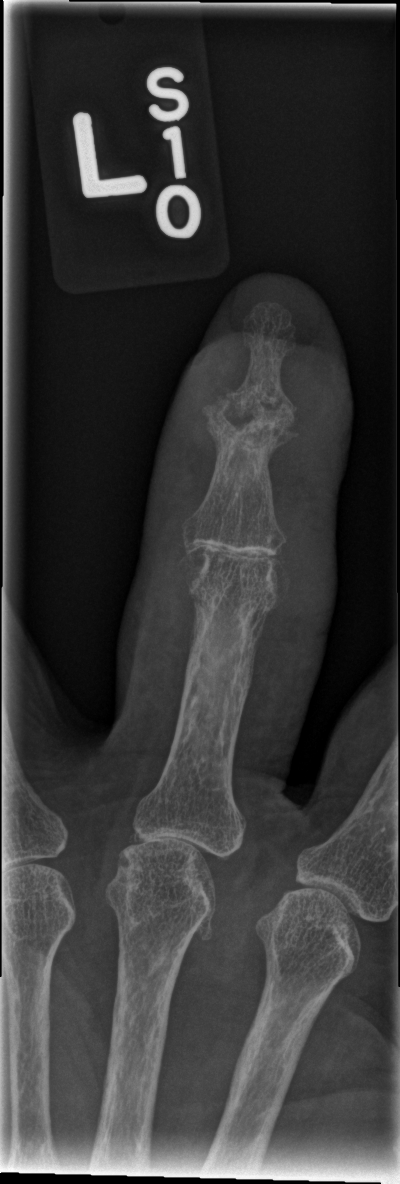

[x finger obl left]
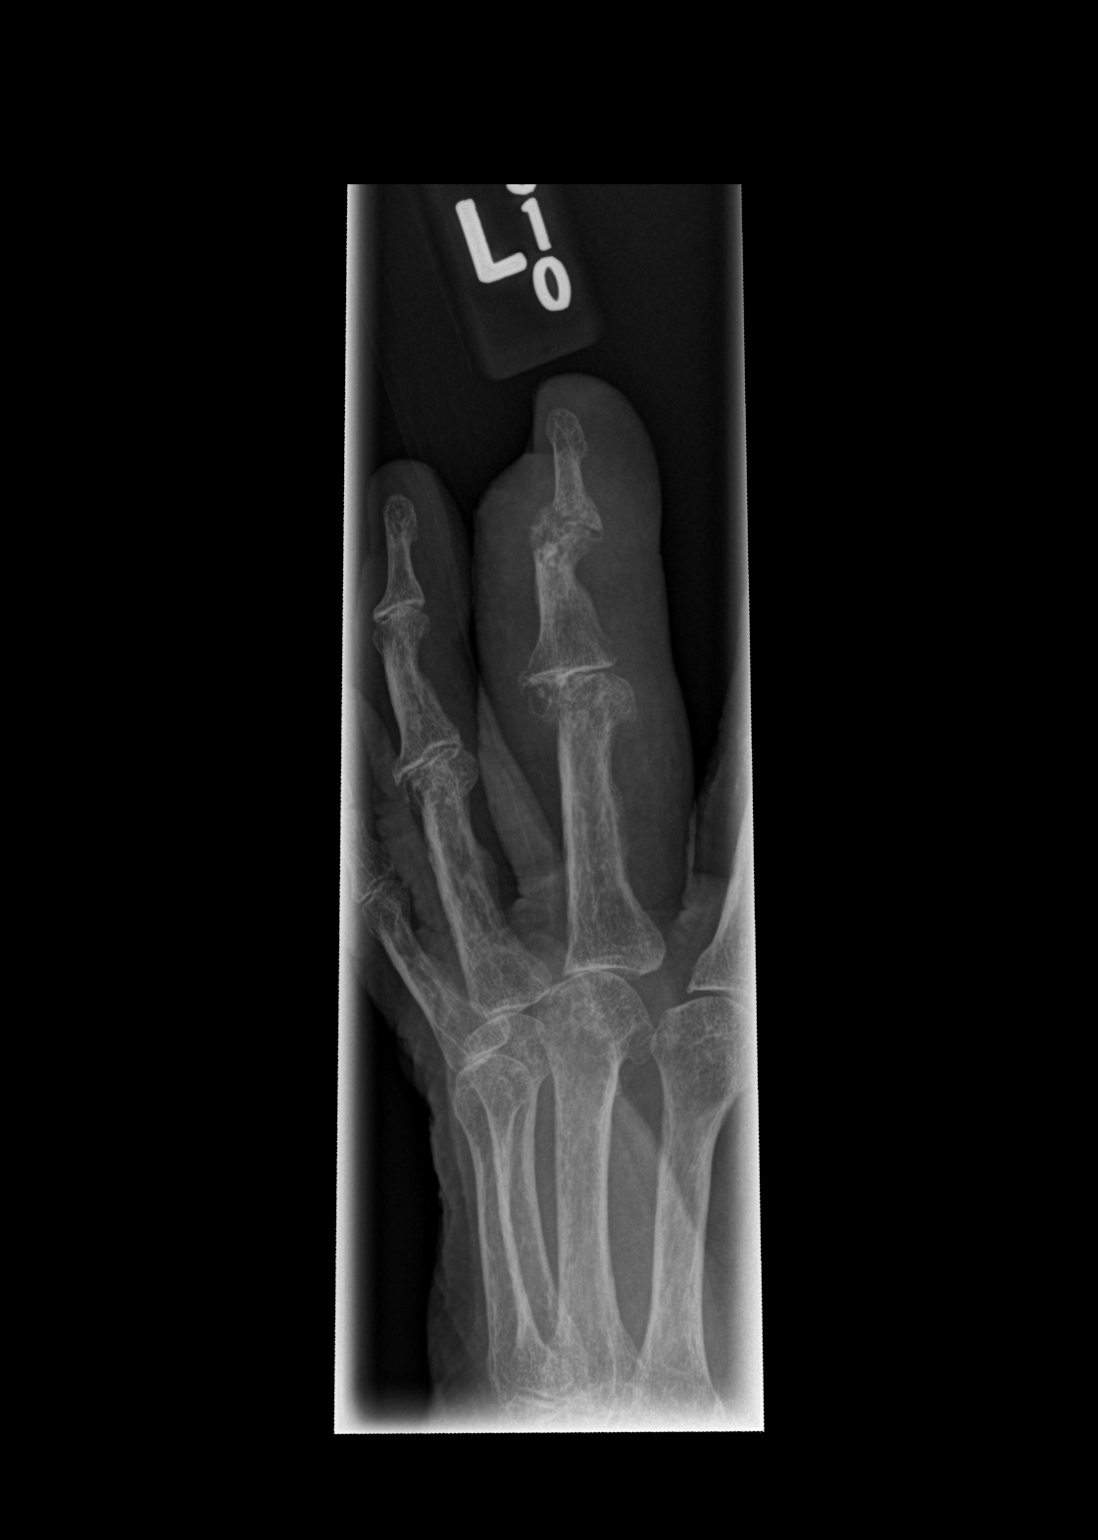

[x finger lat left]
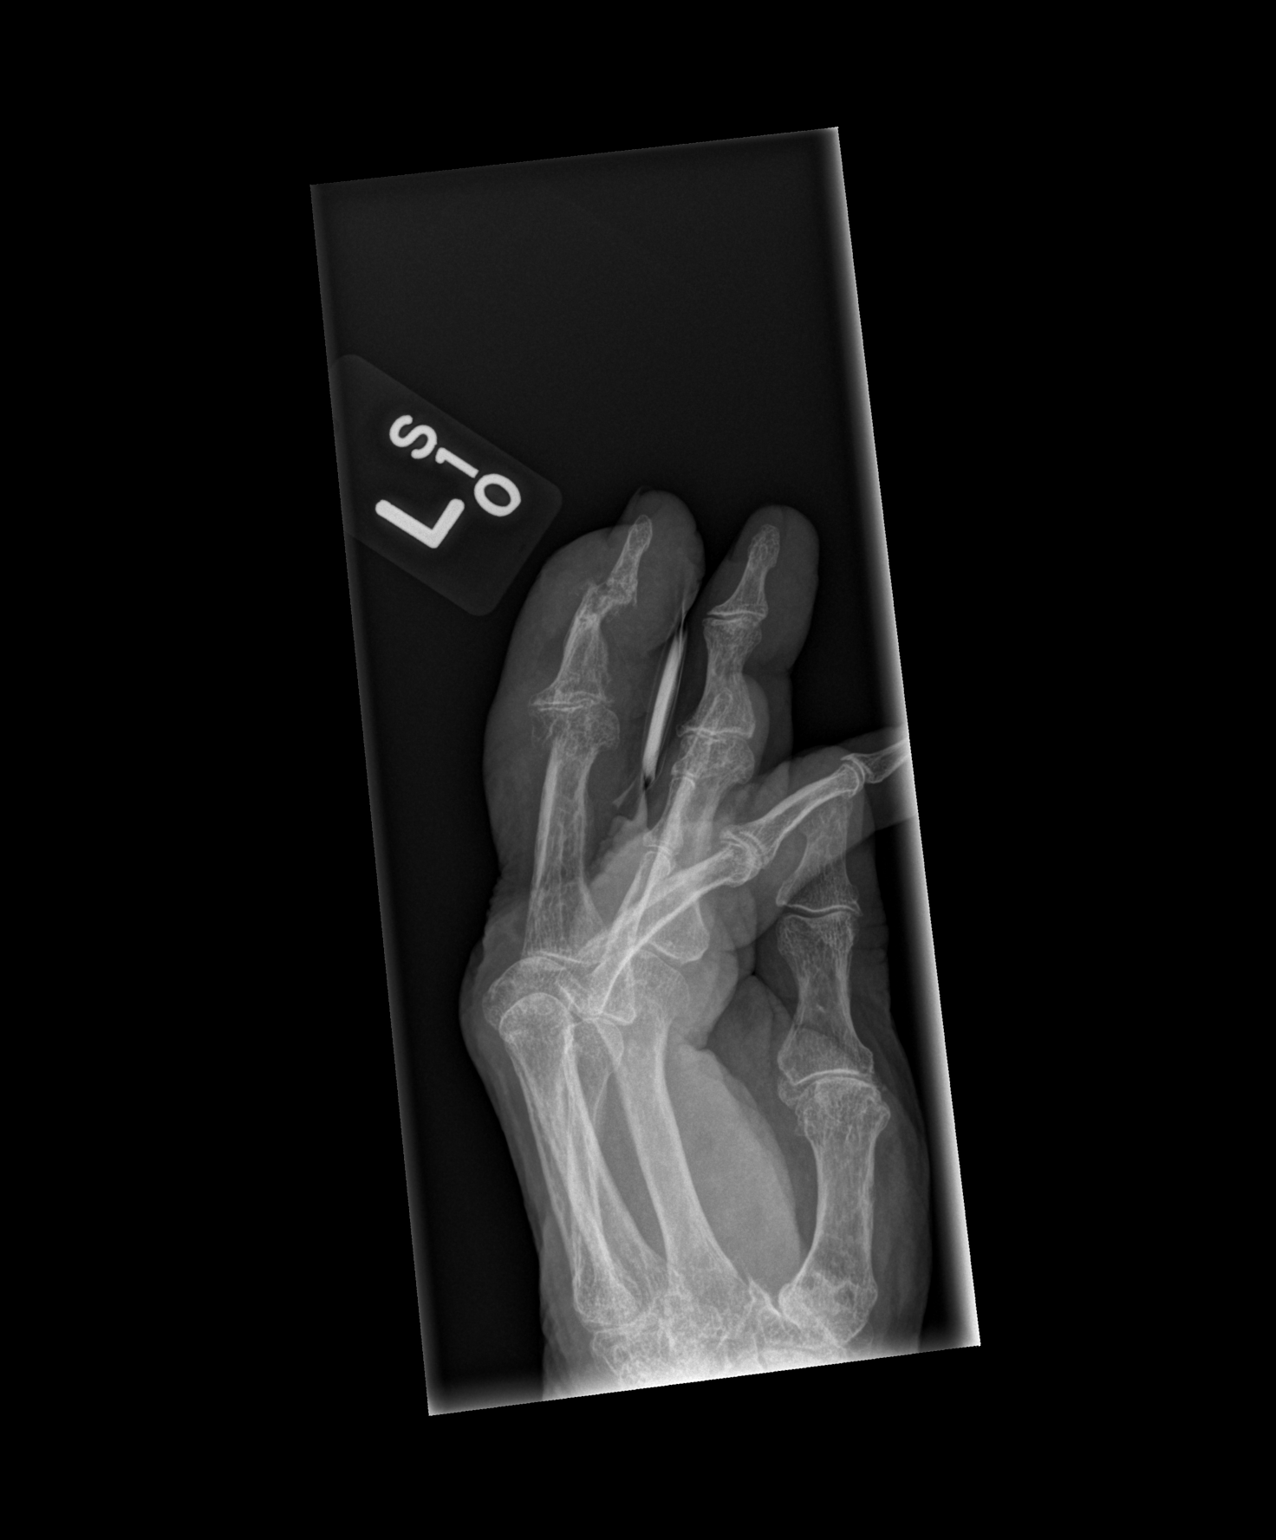

[3 of 3 positions shown; findings below may reference images not displayed]

FINDINGS: Advanced degenerative changes within the IP joints of the left
middle finger, most pronounced in the DIP joint. Deformity noted at
the DIP joint. No fracture. Diffuse soft tissue swelling.
IMPRESSION: Advanced degenerative changes in the IP joints, most pronounced in
the DIP joint with deformity and diffuse soft tissue swelling. No
acute bony abnormality.
